# Patient Record
Sex: Male | Born: 1955 | Race: White | Hispanic: No | Marital: Married | Smoking: Never smoker
Health system: Southern US, Community
[De-identification: ages and names within clinical notes are randomized; demographics above are authoritative.]

## PROBLEM LIST (undated history)

## (undated) DIAGNOSIS — R002 Palpitations: Secondary | ICD-10-CM

## (undated) DIAGNOSIS — F329 Major depressive disorder, single episode, unspecified: Secondary | ICD-10-CM

## (undated) DIAGNOSIS — H538 Other visual disturbances: Secondary | ICD-10-CM

## (undated) DIAGNOSIS — Z9889 Other specified postprocedural states: Secondary | ICD-10-CM

## (undated) DIAGNOSIS — F32A Depression, unspecified: Secondary | ICD-10-CM

## (undated) DIAGNOSIS — K219 Gastro-esophageal reflux disease without esophagitis: Secondary | ICD-10-CM

## (undated) DIAGNOSIS — M1990 Unspecified osteoarthritis, unspecified site: Secondary | ICD-10-CM

## (undated) DIAGNOSIS — F419 Anxiety disorder, unspecified: Secondary | ICD-10-CM

## (undated) DIAGNOSIS — E785 Hyperlipidemia, unspecified: Secondary | ICD-10-CM

## (undated) DIAGNOSIS — R112 Nausea with vomiting, unspecified: Secondary | ICD-10-CM

## (undated) HISTORY — DX: Palpitations: R00.2

## (undated) HISTORY — DX: Other visual disturbances: H53.8

## (undated) HISTORY — PX: COLONOSCOPY: SHX174

---
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
## 1996-10-10 HISTORY — PX: SHOULDER ARTHROSCOPY: SHX128

## 2005-01-04 ENCOUNTER — Encounter: Admission: RE | Admit: 2005-01-04 | Discharge: 2005-01-04 | Payer: Self-pay | Admitting: Unknown Physician Specialty

## 2005-02-02 ENCOUNTER — Encounter: Admission: RE | Admit: 2005-02-02 | Discharge: 2005-02-02 | Payer: Self-pay | Admitting: Unknown Physician Specialty

## 2005-02-23 ENCOUNTER — Encounter: Admission: RE | Admit: 2005-02-23 | Discharge: 2005-02-23 | Payer: Self-pay | Admitting: Unknown Physician Specialty

## 2005-05-30 ENCOUNTER — Ambulatory Visit (HOSPITAL_COMMUNITY): Admission: RE | Admit: 2005-05-30 | Discharge: 2005-05-30 | Payer: Self-pay | Admitting: Orthopedic Surgery

## 2005-05-30 ENCOUNTER — Ambulatory Visit (HOSPITAL_BASED_OUTPATIENT_CLINIC_OR_DEPARTMENT_OTHER): Admission: RE | Admit: 2005-05-30 | Discharge: 2005-05-30 | Payer: Self-pay | Admitting: Orthopedic Surgery

## 2005-05-30 HISTORY — PX: SHOULDER ARTHROSCOPY WITH SUBACROMIAL DECOMPRESSION: SHX5684

## 2005-09-12 ENCOUNTER — Encounter: Admission: RE | Admit: 2005-09-12 | Discharge: 2005-09-12 | Payer: Self-pay | Admitting: Unknown Physician Specialty

## 2006-04-13 ENCOUNTER — Encounter: Admission: RE | Admit: 2006-04-13 | Discharge: 2006-04-13 | Payer: Self-pay | Admitting: Unknown Physician Specialty

## 2008-11-18 ENCOUNTER — Encounter (INDEPENDENT_AMBULATORY_CARE_PROVIDER_SITE_OTHER): Payer: Self-pay | Admitting: Surgery

## 2008-11-18 ENCOUNTER — Ambulatory Visit (HOSPITAL_COMMUNITY): Admission: RE | Admit: 2008-11-18 | Discharge: 2008-11-18 | Payer: Self-pay | Admitting: Surgery

## 2008-11-18 HISTORY — PX: CHOLECYSTECTOMY: SHX55

## 2011-01-25 LAB — URINALYSIS, ROUTINE W REFLEX MICROSCOPIC
Glucose, UA: NEGATIVE mg/dL
Ketones, ur: NEGATIVE mg/dL
Nitrite: NEGATIVE
Protein, ur: NEGATIVE mg/dL
pH: 5 (ref 5.0–8.0)

## 2011-01-25 LAB — DIFFERENTIAL
Basophils Relative: 0 % (ref 0–1)
Lymphs Abs: 1.6 10*3/uL (ref 0.7–4.0)
Monocytes Relative: 14 % — ABNORMAL HIGH (ref 3–12)
Neutro Abs: 3.9 10*3/uL (ref 1.7–7.7)
Neutrophils Relative %: 59 % (ref 43–77)

## 2011-01-25 LAB — CBC
HCT: 44.7 % (ref 39.0–52.0)
Hemoglobin: 15.2 g/dL (ref 13.0–17.0)
MCHC: 34 g/dL (ref 30.0–36.0)
MCV: 91.6 fL (ref 78.0–100.0)
RDW: 13.6 % (ref 11.5–15.5)

## 2011-01-25 LAB — COMPREHENSIVE METABOLIC PANEL
BUN: 13 mg/dL (ref 6–23)
CO2: 27 mEq/L (ref 19–32)
Calcium: 9.3 mg/dL (ref 8.4–10.5)
GFR calc non Af Amer: >60 mL/min (ref >60)
Glucose, Bld: 76 mg/dL (ref 70–99)
Total Protein: 6.7 g/dL (ref 6.0–8.3)

## 2011-01-25 LAB — LIPASE, BLOOD: Lipase: 29 U/L (ref 11–59)

## 2011-01-25 LAB — URINE MICROSCOPIC-ADD ON

## 2011-02-22 NOTE — Op Note (Signed)
[name], [name]                 ACCOUNT NO.:  [id number]   MEDICAL RECORD NO.:  [id number]          PATIENT TYPE:  AMB   LOCATION:  SDS                          FACILITY:  [facility]   PHYSICIAN:  Currie Paris, M.D.DATE OF BIRTH:  Jul 27, 1956   DATE OF PROCEDURE:  DATE OF DISCHARGE:  11/18/2008                               OPERATIVE REPORT   PREOPERATIVE DIAGNOSES:  1. Gallstones.  2. Hemangioma of the liver.   POSTOPERATIVE DIAGNOSES:  1. Gallstones.  2. Hemangioma of the liver.   OPERATION:  Laparoscopic cholecystectomy with operative cholangiogram.   SURGEON:  Currie Paris, MD   ASSISTANT:  Ollen Gross. Vernell Morgans, M.D.   ANESTHESIA:  General endotracheal.   CLINICAL HISTORY:  This is a 55 year old gentleman with biliary type  symptoms and gallstones on ultrasound.  He was incidentally found to  have a large perhaps 10-cm hemangioma of the right lobe of the liver  somewhat posteriorly-inferiorly oriented by scan.  We plan to take his  gallbladder out and do nothing with the hemangioma.   DESCRIPTION OF PROCEDURE:  I saw the patient in the holding area and we  confirmed plans for surgery as outlined above.  He had no further  questions.   He was taken to the operating room and after satisfactory general  endotracheal anesthesia have been obtained, the abdomen was prepped was  draped.  The time-out was performed.   I used 0.25% plain Marcaine for each incision.  The umbilicus incision  was made.  The fascia was opened and the peritoneal cavity entered under  direct vision.  A pursestring was placed, the Hasson introduced and the  abdomen insufflated to 15.   I placed the camera, we saw no obvious abnormalities with exception of  few adhesions to the gallbladder.  The hemangioma itself was not  visible.   I put a 10/11 trocar in the epigastrium and two 5's laterally all under  direct vision.  The patient was put in reverse Trendelenburg and tilted  to the  left.   The omental adhesions were taken down, and the neck of the gallbladder  grasped and retracted laterally.  I opened the peritoneum on either side  of the gallbladder and identified a long segment of cystic duct and the  anterior branch of the cystic artery, put 1 clip on each.   A Cook catheter was introduced percutaneously.  The cystic duct was  opened and the catheter placed and operative angiography done.  This  showed good filling of the duodenum.  No filling defects in the  gallbladder and what appeared to me to be normal anatomy with a fairly  long cystic duct remnant.   The cystic duct was then triple clipped on the stay side and divided.  I  put some additional clips on the artery and what appeared to be some  fibrous tissue just adjacent to it and divided that.  A further  dissection showed what I believed was the posterior branch of the  artery, which was then clipped and divided.  The gallbladder was removed  from below to above.  Some vascular structure almost at the top of the  gallbladder was clipped as it was divided and the gallbladder was  removed and placed in the bag, which we irrigated and made sure  everything was dry.  We could not see the hemangioma on the surface of  the liver as it appeared to be covered by thin rim of normal liver  tissue, but there was clearly a bulging in the lower posterior right  lobe of the liver consistent with the mass seen on the CT scan.   The gallbladder was then brought out of the umbilical port.  The abdomen  was re-insufflated and a final irrigation and check for hemostasis was  made.  There was no evidence of bleeding or bile leaks.   The lateral ports removed, and there was no bleeding.  The umbilical  site was closed with the pursestring.  The abdomen was deflated through  the epigastric port.  The skin was closed with 4-0 Monocryl subcuticular  plus Dermabond.   The patient tolerated the procedure well.  There were  no operative  complications.  All counts were correct.      Currie Paris, M.D.  Electronically Signed     CJS/MEDQ  D:  11/18/2008  T:  11/18/2008  Job:  [id number]

## 2011-02-25 NOTE — Op Note (Signed)
NAMESAMIK, BALKCOM                 ACCOUNT NO.:  [id number]   MEDICAL RECORD NO.:  [id number]          PATIENT TYPE:  AMB   LOCATION:  DSC                          FACILITY:  [facility]   PHYSICIAN:  Robert A. Thurston Hole, M.D. DATE OF BIRTH:  [birth year]   DATE OF PROCEDURE:  05/30/2005  DATE OF DISCHARGE:                                 OPERATIVE REPORT   PREOPERATIVE DIAGNOSES:  1.  Right shoulder labrum tear.  2.  Right shoulder loose bodies.  3.  Right shoulder chondromalacia.  4.  Right shoulder impingement.   POSTOPERATIVE DIAGNOSES:  1.  Right shoulder examination under anesthesia followed by arthroscopic      loose body excision, chondroplasty,  partial labrum tear debridement.  2.  Right shoulder subacromial decompression.   SURGEON:  Dr. Salvatore Marvel.   ASSISTANT:  Kirstin Tomasa Rand, P.A.   ANESTHESIA:  General.   OPERATIVE TIME:  40 minutes.   COMPLICATIONS:  None.   INDICATION FOR PROCEDURE:  Mr. Dentinger is a 55 year old gentleman who has had 3-  4 months of increasing right shoulder pain with examined MRI documenting a  labrum tear with chondromalacia and impingement who has failed conservative  care and is now to undergo arthroscopy.   DESCRIPTION:  Mr. Mash was brought to the operating room on May 30, 2005,  after an interscalene block had been placed in the holding room by  anesthesia.  He was placed on the operative table in a supine position.  After being placed under general anesthesia, his right shoulder was  examined.  He had full range of motion and his shoulder was stable to  ligamentous exam.  He was then placed in a beach chair position and his  shoulder and arm was prepped using sterile DuraPrep and draped using sterile  technique.  Originally through an posterior arthroscope portal the  arthroscope with a pump attached was placed into an anterior portal and an  arthroscopic probe was placed.  On initial inspection, the articular  cartilage and the  glenohumeral joint showed significant grade 3  chondromalacia over 50% of the glenoid and the humeral head.  This was  debrided.  There were multiple cartilaginous loose bodies floating in the  joint and these were all removed.  The labrum showed tearing of the superior  anterior labrum, 25-30%, which was debrided.  The biceps tendon anchor and  biceps tendon was intact.  The inferior labrum was frayed, this was  debrided, but the anterior inferior glenohumeral ligament complex was  intact.  The posterior labrum was partially torn and frayed 25-30% and this  was debrided but it was not attached from the glenoid rim.  The rotator cuff  was thoroughly inspected.  There was found to be no evidence of a tear.  The  inferior capsular recess showed multiple small cartilaginous loose bodies  and these were all removed as well as loose bodies from the subscapularis  recess.  After this was done, the subacromial space was entered and a  lateral arthroscopic portal was made.  A large amount of scarred bursitis  was  thoroughly removed and resected.  The rotator cuff underneath this was  intact.  There was some impingement noted and a repeat subacromial  decompression was carried out, removing 4-6 mm of the undersurface of the  anterior, anterolateral and anteromedial acromion and CA ligament scarring  and was further debrided and released.  He had had a previous distal  clavicle excision and no further excision was noted or needed in this area,  but there was scar tissue under the [facility] joint area and this was removed with  cautery.  After this was done, the shoulder could be brought through a full  range of motion with no impingement on the rotator cuff.  At this point, it  was felt that all pathology had been satisfactorily addressed.  Instruments  were removed.  The portal was closed with a 3-0 nylon suture.  Sterile  dressings and a sling applied and the patient awakened and taken to the  recovery  room in stable condition.   FOLLOWUP CARE:  Mr. Almas will be followed as an outpatient on Vicodin and  Naprosyn.  Will see him back in the office in a week for sutures out and  followup.      Robert A. Thurston Hole, M.D.  Electronically Signed     RAW/MEDQ  D:  05/30/2005  T:  05/30/2005  Job:  [id number]

## 2011-06-03 ENCOUNTER — Ambulatory Visit
Admission: RE | Admit: 2011-06-03 | Discharge: 2011-06-03 | Disposition: A | Discharge status: Home / Self Care | Payer: BC Managed Care – PPO | Source: Ambulatory Visit | Attending: Emergency Medicine | Admitting: Emergency Medicine

## 2011-06-03 ENCOUNTER — Other Ambulatory Visit: Payer: Self-pay | Admitting: Emergency Medicine

## 2011-06-03 DIAGNOSIS — R05 Cough: Secondary | ICD-10-CM

## 2012-08-15 ENCOUNTER — Other Ambulatory Visit: Payer: Self-pay | Admitting: Orthopedic Surgery

## 2012-08-16 ENCOUNTER — Encounter (HOSPITAL_BASED_OUTPATIENT_CLINIC_OR_DEPARTMENT_OTHER): Payer: Self-pay | Admitting: *Deleted

## 2012-08-20 ENCOUNTER — Encounter (HOSPITAL_BASED_OUTPATIENT_CLINIC_OR_DEPARTMENT_OTHER): Payer: Self-pay | Admitting: *Deleted

## 2012-08-20 NOTE — Progress Notes (Signed)
No cardiac or resp problems 

## 2012-08-22 ENCOUNTER — Encounter (HOSPITAL_BASED_OUTPATIENT_CLINIC_OR_DEPARTMENT_OTHER): Payer: Self-pay | Admitting: *Deleted

## 2012-08-22 ENCOUNTER — Encounter (HOSPITAL_BASED_OUTPATIENT_CLINIC_OR_DEPARTMENT_OTHER)
Admission: RE | Disposition: A | Discharge status: Home / Self Care | Payer: Self-pay | Source: Ambulatory Visit | Attending: Orthopedic Surgery

## 2012-08-22 ENCOUNTER — Ambulatory Visit (HOSPITAL_BASED_OUTPATIENT_CLINIC_OR_DEPARTMENT_OTHER): Payer: BC Managed Care – PPO | Admitting: Anesthesiology

## 2012-08-22 ENCOUNTER — Ambulatory Visit (HOSPITAL_BASED_OUTPATIENT_CLINIC_OR_DEPARTMENT_OTHER)
Admission: RE | Admit: 2012-08-22 | Discharge: 2012-08-22 | Disposition: A | Discharge status: Home / Self Care | Payer: BC Managed Care – PPO | Source: Ambulatory Visit | Attending: Orthopedic Surgery | Admitting: Orthopedic Surgery

## 2012-08-22 ENCOUNTER — Encounter (HOSPITAL_BASED_OUTPATIENT_CLINIC_OR_DEPARTMENT_OTHER): Payer: Self-pay | Admitting: Anesthesiology

## 2012-08-22 DIAGNOSIS — K219 Gastro-esophageal reflux disease without esophagitis: Secondary | ICD-10-CM | POA: Insufficient documentation

## 2012-08-22 DIAGNOSIS — M65839 Other synovitis and tenosynovitis, unspecified forearm: Secondary | ICD-10-CM | POA: Insufficient documentation

## 2012-08-22 DIAGNOSIS — E785 Hyperlipidemia, unspecified: Secondary | ICD-10-CM | POA: Insufficient documentation

## 2012-08-22 DIAGNOSIS — Z79899 Other long term (current) drug therapy: Secondary | ICD-10-CM | POA: Insufficient documentation

## 2012-08-22 DIAGNOSIS — M65849 Other synovitis and tenosynovitis, unspecified hand: Secondary | ICD-10-CM | POA: Insufficient documentation

## 2012-08-22 HISTORY — DX: Gastro-esophageal reflux disease without esophagitis: K21.9

## 2012-08-22 HISTORY — DX: Depression, unspecified: F32.A

## 2012-08-22 HISTORY — DX: Nausea with vomiting, unspecified: R11.2

## 2012-08-22 HISTORY — DX: Major depressive disorder, single episode, unspecified: F32.9

## 2012-08-22 HISTORY — PX: TRIGGER FINGER RELEASE: SHX641

## 2012-08-22 HISTORY — DX: Anxiety disorder, unspecified: F41.9

## 2012-08-22 HISTORY — DX: Hyperlipidemia, unspecified: E78.5

## 2012-08-22 HISTORY — DX: Other specified postprocedural states: Z98.890

## 2012-08-22 HISTORY — DX: Unspecified osteoarthritis, unspecified site: M19.90

## 2012-08-22 LAB — POCT HEMOGLOBIN-HEMACUE: Hemoglobin: 14.3 g/dL (ref 13.0–17.0)

## 2012-08-22 SURGERY — RELEASE, A1 PULLEY, FOR TRIGGER FINGER
Anesthesia: Regional | Site: Thumb | Laterality: Right | Wound class: Clean

## 2012-08-22 MED ORDER — CHLORHEXIDINE GLUCONATE 4 % EX LIQD: 60.0000 mL | Freq: Once | CUTANEOUS | Status: DC

## 2012-08-22 MED ORDER — HYDROMORPHONE HCL PF 1 MG/ML IJ SOLN: 0.2500 mg | INTRAMUSCULAR | Status: DC | PRN

## 2012-08-22 MED ORDER — LIDOCAINE HCL (CARDIAC) 20 MG/ML IV SOLN
INTRAVENOUS | Status: DC | PRN
  Administered 2012-08-22: 50 mg via INTRAVENOUS

## 2012-08-22 MED ORDER — CEFAZOLIN SODIUM-DEXTROSE 2-3 GM-% IV SOLR
2.0000 g | INTRAVENOUS | Status: AC
  Administered 2012-08-22: 2 g via INTRAVENOUS

## 2012-08-22 MED ORDER — PROPOFOL 10 MG/ML IV EMUL
INTRAVENOUS | Status: DC | PRN
  Administered 2012-08-22: 80 ug/kg/min via INTRAVENOUS

## 2012-08-22 MED ORDER — LACTATED RINGERS IV SOLN
INTRAVENOUS | Status: DC
  Administered 2012-08-22: 09:00:00 via INTRAVENOUS

## 2012-08-22 MED ORDER — HYDROCODONE-ACETAMINOPHEN 5-325 MG PO TABS: 1.0000 | ORAL_TABLET | Freq: Four times a day (QID) | ORAL | Status: DC | PRN

## 2012-08-22 MED ORDER — ONDANSETRON HCL 4 MG/2ML IJ SOLN
INTRAMUSCULAR | Status: DC | PRN
  Administered 2012-08-22: 4 mg via INTRAVENOUS

## 2012-08-22 MED ORDER — BUPIVACAINE HCL (PF) 0.25 % IJ SOLN
INTRAMUSCULAR | Status: DC | PRN
  Administered 2012-08-22: 2.5 mL

## 2012-08-22 MED ORDER — MIDAZOLAM HCL 5 MG/5ML IJ SOLN
INTRAMUSCULAR | Status: DC | PRN
  Administered 2012-08-22: 1 mg via INTRAVENOUS

## 2012-08-22 MED ORDER — FENTANYL CITRATE 0.05 MG/ML IJ SOLN
INTRAMUSCULAR | Status: DC | PRN
  Administered 2012-08-22 (×2): 50 ug via INTRAVENOUS

## 2012-08-22 MED ORDER — OXYCODONE HCL 5 MG/5ML PO SOLN: 5.0000 mg | Freq: Once | ORAL | Status: DC | PRN

## 2012-08-22 MED ORDER — OXYCODONE HCL 5 MG PO TABS: 5.0000 mg | ORAL_TABLET | Freq: Once | ORAL | Status: DC | PRN

## 2012-08-22 SURGICAL SUPPLY — 36 items
BANDAGE COBAN STERILE 2 (GAUZE/BANDAGES/DRESSINGS) ×2 IMPLANT
BLADE SURG 15 STRL LF DISP TIS (BLADE) ×1 IMPLANT
BLADE SURG 15 STRL SS (BLADE) ×2
BNDG CMPR 9X4 STRL LF SNTH (GAUZE/BANDAGES/DRESSINGS)
BNDG ESMARK 4X9 LF (GAUZE/BANDAGES/DRESSINGS) IMPLANT
CHLORAPREP W/TINT 26ML (MISCELLANEOUS) ×2 IMPLANT
CLOTH BEACON ORANGE TIMEOUT ST (SAFETY) ×2 IMPLANT
CORDS BIPOLAR (ELECTRODE) IMPLANT
COVER MAYO STAND STRL (DRAPES) ×2 IMPLANT
COVER TABLE BACK 60X90 (DRAPES) ×2 IMPLANT
CUFF TOURNIQUET SINGLE 18IN (TOURNIQUET CUFF) IMPLANT
DECANTER SPIKE VIAL GLASS SM (MISCELLANEOUS) IMPLANT
DRAPE EXTREMITY T 121X128X90 (DRAPE) ×2 IMPLANT
DRAPE SURG 17X23 STRL (DRAPES) ×2 IMPLANT
GAUZE XEROFORM 1X8 LF (GAUZE/BANDAGES/DRESSINGS) ×2 IMPLANT
GLOVE BIO SURGEON STRL SZ 6.5 (GLOVE) IMPLANT
GLOVE BIOGEL PI IND STRL 8.5 (GLOVE) ×1 IMPLANT
GLOVE BIOGEL PI INDICATOR 8.5 (GLOVE) ×1
GLOVE SKINSENSE NS SZ7.0 (GLOVE) ×1
GLOVE SKINSENSE STRL SZ7.0 (GLOVE) IMPLANT
GLOVE SURG ORTHO 8.0 STRL STRW (GLOVE) ×2 IMPLANT
GOWN BRE IMP PREV XXLGXLNG (GOWN DISPOSABLE) ×2 IMPLANT
GOWN PREVENTION PLUS XLARGE (GOWN DISPOSABLE) ×2 IMPLANT
NEEDLE 27GAX1X1/2 (NEEDLE) ×2 IMPLANT
NS IRRIG 1000ML POUR BTL (IV SOLUTION) ×2 IMPLANT
PACK BASIN DAY SURGERY FS (CUSTOM PROCEDURE TRAY) ×2 IMPLANT
PADDING CAST ABS 4INX4YD NS (CAST SUPPLIES) ×1
PADDING CAST ABS COTTON 4X4 ST (CAST SUPPLIES) ×1 IMPLANT
SPONGE GAUZE 4X4 12PLY (GAUZE/BANDAGES/DRESSINGS) ×2 IMPLANT
STOCKINETTE 4X48 STRL (DRAPES) ×2 IMPLANT
SUT VICRYL RAPIDE 4/0 PS 2 (SUTURE) ×2 IMPLANT
SYR BULB 3OZ (MISCELLANEOUS) ×2 IMPLANT
SYR CONTROL 10ML LL (SYRINGE) IMPLANT
TOWEL OR 17X24 6PK STRL BLUE (TOWEL DISPOSABLE) ×4 IMPLANT
UNDERPAD 30X30 INCONTINENT (UNDERPADS AND DIAPERS) ×2 IMPLANT
WATER STERILE IRR 1000ML POUR (IV SOLUTION) ×2 IMPLANT

## 2012-08-22 NOTE — Anesthesia Postprocedure Evaluation (Signed)
  Anesthesia Post-op Note  Patient: [name]  Procedure(s) Performed: Procedure(s) (LRB) with comments: RELEASE TRIGGER FINGER/A-1 PULLEY (Right)  Patient Location: PACU  Anesthesia Type:MAC and Bier block  Level of Consciousness: awake, alert  and oriented  Airway and Oxygen Therapy: Patient Spontanous Breathing  Post-op Pain: none  Post-op Assessment: Post-op Vital signs reviewed, Patient's Cardiovascular Status Stable, Respiratory Function Stable, Patent Airway and No signs of Nausea or vomiting  Post-op Vital Signs: Reviewed and stable  Complications: No apparent anesthesia complications

## 2012-08-22 NOTE — Anesthesia Procedure Notes (Signed)
Procedure Name: MAC Date/Time: 08/22/2012 10:19 AM Performed by: Gar Gibbon Pre-anesthesia Checklist: Patient identified, Timeout performed, Emergency Drugs available, Suction available and Patient being monitored Patient Re-evaluated:Patient Re-evaluated prior to inductionOxygen Delivery Method: Simple face mask Placement Confirmation: positive ETCO2

## 2012-08-22 NOTE — H&P (Signed)
[name] is a 56 year-old right-hand dominant male referred by Dr. Cleda Clarks for consultation with respect to catching of his right thumb.  This has been going on for approximately three months.  He has no history of injury. He has been taking Arthrotek, he has no history of diabetes or thyroid problems.  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  He complains of constant, moderate dull aching type pain with a feeling of swelling.  It has not significantly changed.  Activity makes it worse.  The Arthrotek has not significantly helped. There is no family history of diabetes, there is a family history of arthritis.   ALLERGIES:    None.  MEDICATIONS:     Arthrotek, sertraline, simvastatin.  SURGICAL HISTORY:    Right shoulder surgery x 2, cholecystectomy.  FAMILY MEDICAL HISTORY:   Positive for heart disease and arthritis.  SOCIAL HISTORY:    He does not smoke, drinks socially, married.   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    REVIEW OF SYSTEMS:    Positive for glasses, depression, nervousness, otherwise negative 14 points.  [name] is an 56 y.o. male.   Chief Complaint: STS rt thumb HPI: see above  Past Medical History  Diagnosis Date  . Hyperlipemia   . Arthritis   . GERD (gastroesophageal reflux disease)   . Depression   . Anxiety   . PONV (postoperative nausea and vomiting)     Past Surgical History  Procedure Date  . Shoulder arthroscopy with subacromial decompression 05/30/2005    right - with loose body exc., chondroplasty, partial labrum tear debridement  . Shoulder arthroscopy 1998    rt  . Colonoscopy   . Cholecystectomy 11/18/2008    lap. chole.  . Vein ligation 1978    left    History reviewed. No pertinent family history. Social History:  reports that he has never smoked. He does not have any smokeless tobacco history on file. He reports that he drinks alcohol. He reports that he does not use illicit drugs.  Allergies: No Known  Allergies  Medications Prior to Admission  Medication Sig Dispense Refill  . aspirin 81 MG tablet Take 81 mg by mouth every evening.      . fish oil-omega-3 fatty acids 1000 MG capsule Take 2 g by mouth daily.      . Multiple Vitamins-Minerals (MULTIVITAMIN WITH MINERALS) tablet Take 1 tablet by mouth every evening.      Marland Kitchen omeprazole (PRILOSEC) 20 MG capsule Take 20 mg by mouth every evening.      . sertraline (ZOLOFT) 50 MG tablet Take 50 mg by mouth every evening.      . simvastatin (ZOCOR) 20 MG tablet Take 20 mg by mouth at bedtime.      Marland Kitchen zolpidem (AMBIEN) 5 MG tablet Take 5 mg by mouth at bedtime as needed.        No results found for this or any previous visit (from the past 48 hour(s)).  No results found.   Pertinent items are noted in HPI.  Blood pressure 158/102, pulse 65, temperature 97.4 F (36.3 C), temperature source Oral, resp. rate 20, height 6\' 5"  (1.956 m), weight 99.338 kg (219 lb), SpO2 96.00%.  General appearance: alert, cooperative and appears stated age Head: Normocephalic, without obvious abnormality Neck: no adenopathy Resp: clear to auscultation bilaterally Cardio: regular rate and rhythm, S1, S2 normal, no murmur, click, rub or gallop GI: soft, non-tender; bowel sounds  normal; no masses,  no organomegaly Extremities: extremities normal, atraumatic, no cyanosis or edema Pulses: 2+ and symmetric Skin: Skin color, texture, turgor normal. No rashes or lesions Neurologic: Grossly normal Incision/Wound: na  Assessment/Plan DIAGNOSIS:    Stenosing tenosynovitis, failure of injections, conservative treatment right thumb.    He would like to have this surgically released.  The pre, peri and postoperative course were discussed along with the risks and complications.  The patient is aware there is no guarantee with the surgery, possibility of infection, recurrence, injury to arteries, nerves, tendons, incomplete relief of symptoms and dystrophy.  He is  scheduled for release A-1 pulley right thumb as an outpatient.    Chaney Ingram R 08/22/2012, 9:23 AM

## 2012-08-22 NOTE — Transfer of Care (Signed)
Immediate Anesthesia Transfer of Care Note  Patient: [name]  Procedure(s) Performed: Procedure(s) (LRB) with comments: RELEASE TRIGGER FINGER/A-1 PULLEY (Right)  Patient Location: PACU  Anesthesia Type:Bier block  Level of Consciousness: awake, alert  and oriented  Airway & Oxygen Therapy: Patient Spontanous Breathing  Post-op Assessment: Report given to PACU RN and Post -op Vital signs reviewed and stable  Post vital signs: Reviewed and stable  Complications: No apparent anesthesia complications

## 2012-08-22 NOTE — Anesthesia Preprocedure Evaluation (Signed)
Anesthesia Evaluation  Patient identified by MRN, date of birth, ID band Patient awake    Reviewed: Allergy & Precautions, H&P , NPO status , Patient's Chart, lab work & pertinent test results  History of Anesthesia Complications (+) PONV  Airway Mallampati: I TM Distance: >3 FB Neck ROM: Full    Dental No notable dental hx. (+) Teeth Intact and Dental Advisory Given   Pulmonary neg pulmonary ROS,  breath sounds clear to auscultation  Pulmonary exam normal       Cardiovascular negative cardio ROS  Rhythm:Regular Rate:Normal     Neuro/Psych PSYCHIATRIC DISORDERS negative neurological ROS     GI/Hepatic Neg liver ROS, GERD-  Medicated and Controlled,  Endo/Other  negative endocrine ROS  Renal/GU negative Renal ROS  negative genitourinary   Musculoskeletal   Abdominal   Peds  Hematology negative hematology ROS (+)   Anesthesia Other Findings   Reproductive/Obstetrics negative OB ROS                           Anesthesia Physical Anesthesia Plan  ASA: II  Anesthesia Plan: MAC and Bier Block   Post-op Pain Management:    Induction: Intravenous  Airway Management Planned: Simple Face Mask  Additional Equipment:   Intra-op Plan:   Post-operative Plan:   Informed Consent: I have reviewed the patients History and Physical, chart, labs and discussed the procedure including the risks, benefits and alternatives for the proposed anesthesia with the patient or authorized representative who has indicated his/her understanding and acceptance.   Dental advisory given  Plan Discussed with: CRNA  Anesthesia Plan Comments:         Anesthesia Quick Evaluation

## 2012-08-22 NOTE — Brief Op Note (Signed)
08/22/2012  10:46 AM  PATIENT:  [name]  56 y.o. male  PRE-OPERATIVE DIAGNOSIS:  stenosing tenosynovitis RIGHT THUMB  POST-OPERATIVE DIAGNOSIS:  stenosing tenosynovitis RIGHT THUMB  PROCEDURE:  Procedure(s) (LRB) with comments: RELEASE TRIGGER FINGER/A-1 PULLEY (Right)  SURGEON:  Surgeon(s) and Role:    * Nicki Reaper, MD - Primary  PHYSICIAN ASSISTANT:   ASSISTANTS: none   ANESTHESIA:   local and regional  EBL:  Total I/O In: 200 [I.V.:200] Out: -   BLOOD ADMINISTERED:none  DRAINS: none   LOCAL MEDICATIONS USED:  MARCAINE     SPECIMEN:  No Specimen  DISPOSITION OF SPECIMEN:  N/A  COUNTS:  YES  TOURNIQUET:  * Missing tourniquet times found for documented tourniquets in log:  [id number] *  DICTATION: .Other Dictation: Dictation Number [id number]  PLAN OF CARE: Discharge to home after PACU  PATIENT DISPOSITION:  PACU - hemodynamically stable.

## 2012-08-22 NOTE — Op Note (Signed)
Dictation Number [id number]

## 2012-08-23 ENCOUNTER — Encounter (HOSPITAL_BASED_OUTPATIENT_CLINIC_OR_DEPARTMENT_OTHER): Payer: Self-pay | Admitting: Orthopedic Surgery

## 2012-08-23 NOTE — Op Note (Signed)
[name], [name]                 ACCOUNT NO.:  [id number]  MEDICAL RECORD NO.:  [id number]  LOCATION:                                 FACILITY:  PHYSICIAN:  Cindee Salt, M.D.            DATE OF BIRTH:  DATE OF PROCEDURE:  08/22/2012 DATE OF DISCHARGE:                              OPERATIVE REPORT   PREOPERATIVE DIAGNOSIS:  Stenosing tenosynovitis, right thumb.  POSTOPERATIVE DIAGNOSIS:  Stenosing tenosynovitis, right thumb.  OPERATION:  Release A1 pulley, right thumb.  SURGEON:  Cindee Salt, MD  ANESTHESIA:  Forearm-based IV regional with local infiltration.  ANESTHESIOLOGIST:  Zenon Mayo, MD  HISTORY:  The patient is a 56 year old male with a history of triggering of his right thumb.  This has not responded to conservative treatment. He has elected to undergo surgical release.  Pre, peri, and postoperative course are discussed along with risks and complications. He is aware that there is no guarantee with the surgery, possibility of infection; recurrence of injury to arteries, nerves, tendons, incomplete relief of symptoms, dystrophy.  In the preoperative area, the patient is seen, the extremity marked by both the patient and surgeon.  Antibiotic given.  PROCEDURE:  The patient was brought to the operating room where forearm- based IV regional anesthetic was carried out without difficulty.  He was prepped using ChloraPrep, supine position, right arm free.  A 3-minute dry time was allowed.  Time-out taken, confirming the patient and procedure.  A transverse incision was made over the A1 pulley of the right thumb, carried down through subcutaneous tissue.  A very thickened A1 pulley was immediately identified.  This was isolated, retractors placed protecting the digital nerves.  An incision was then made on the radial aspect of the A1 pulley.  Oblique pulley was left intact.  The synovium proximally was separated with blunt dissection.  Finger placed through full  range motion, no further triggering was noted.  The wound was irrigated and closed with interrupted 4-0 Vicryl Rapide sutures. Local infiltration with 3 mL of 0.25% Marcaine without epinephrine was given.  A sterile compressive dressing was applied.  On deflation of the tourniquet, all fingers immediately pinked.  He was taken to the recovery room for observation in satisfactory condition.  He will be discharged home to return to the [facility] in 1 week on Vicodin.         ______________________________ Cindee Salt, M.D.    GK/MEDQ  D:  08/22/2012  T:  08/23/2012  Job:  [id number]

## 2017-05-05 ENCOUNTER — Emergency Department (HOSPITAL_COMMUNITY)
Admission: EM | Admit: 2017-05-05 | Discharge: 2017-05-06 | Disposition: A | Discharge status: Home / Self Care | Payer: BLUE CROSS/BLUE SHIELD | Attending: Emergency Medicine | Admitting: Emergency Medicine

## 2017-05-05 ENCOUNTER — Encounter (HOSPITAL_COMMUNITY): Payer: Self-pay

## 2017-05-05 DIAGNOSIS — Y929 Unspecified place or not applicable: Secondary | ICD-10-CM | POA: Diagnosis not present

## 2017-05-05 DIAGNOSIS — Y939 Activity, unspecified: Secondary | ICD-10-CM | POA: Diagnosis not present

## 2017-05-05 DIAGNOSIS — W010XXA Fall on same level from slipping, tripping and stumbling without subsequent striking against object, initial encounter: Secondary | ICD-10-CM | POA: Insufficient documentation

## 2017-05-05 DIAGNOSIS — Z23 Encounter for immunization: Secondary | ICD-10-CM | POA: Diagnosis not present

## 2017-05-05 DIAGNOSIS — Y999 Unspecified external cause status: Secondary | ICD-10-CM | POA: Diagnosis not present

## 2017-05-05 DIAGNOSIS — S51011A Laceration without foreign body of right elbow, initial encounter: Secondary | ICD-10-CM | POA: Insufficient documentation

## 2017-05-05 DIAGNOSIS — S025XXA Fracture of tooth (traumatic), initial encounter for closed fracture: Secondary | ICD-10-CM | POA: Diagnosis not present

## 2017-05-05 DIAGNOSIS — Z79899 Other long term (current) drug therapy: Secondary | ICD-10-CM | POA: Insufficient documentation

## 2017-05-05 NOTE — ED Triage Notes (Signed)
Pt tripped and fell tonight. He presents with a laceration on his R elbow and an abrasion to his R knee. He states that he also chipped a tooth. He denies LOC, blood thinners, or head pain. A&Ox4. Ambulatory.

## 2017-05-06 ENCOUNTER — Emergency Department (HOSPITAL_COMMUNITY): Payer: BLUE CROSS/BLUE SHIELD

## 2017-05-06 MED ORDER — LIDOCAINE HCL 2 % IJ SOLN: 20.0000 mL | Freq: Once | INTRAMUSCULAR | Status: DC

## 2017-05-06 MED ORDER — AMOXICILLIN-POT CLAVULANATE 875-125 MG PO TABS: 1.0000 | ORAL_TABLET | Freq: Two times a day (BID) | ORAL | 0 refills | Status: DC

## 2017-05-06 MED ORDER — LIDOCAINE HCL (PF) 1 % IJ SOLN
30.0000 mL | Freq: Once | INTRAMUSCULAR | Status: AC
  Administered 2017-05-06: 30 mL
  Filled 2017-05-06: qty 30

## 2017-05-06 MED ORDER — TETANUS-DIPHTH-ACELL PERTUSSIS 5-2.5-18.5 LF-MCG/0.5 IM SUSP
0.5000 mL | Freq: Once | INTRAMUSCULAR | Status: AC
  Administered 2017-05-06: 0.5 mL via INTRAMUSCULAR
  Filled 2017-05-06: qty 0.5

## 2017-05-06 NOTE — ED Provider Notes (Signed)
[facility]-EMERGENCY DEPT Provider Note   CSN: [id number] Arrival date & time: 05/05/17  2311     History   Chief Complaint Chief Complaint  Patient presents with  . Extremity Laceration    R elbow  . Fall    HPI COPE MARTE is a 61 y.o. male.  HPI Patient presents to the emergency department with injuries from a fall.  The patient states he tripped and fell, causing a laceration to his right elbow abrasion to his right knee.  He also states that he broke one of his teeth and has a abrasion to the right earlobe states that he did not hit his head significantly.  He did not lose consciousness.  He is having no neck pain and back pain.  Patient not take any medications prior to route.  We applied pressure to the wound to stop the bleeding.  Patient denies headache, blurred vision, weakness, numbness, dizziness, back pain, neck pain, chest pain, shortness breath, abdominal pain or syncope Past Medical History:  Diagnosis Date  . Anxiety   . Arthritis   . Depression   . GERD (gastroesophageal reflux disease)   . Hyperlipemia   . PONV (postoperative nausea and vomiting)     There are no active problems to display for this patient.   Past Surgical History:  Procedure Laterality Date  . CHOLECYSTECTOMY  11/18/2008   lap. chole.  Marland Kitchen COLONOSCOPY    . SHOULDER ARTHROSCOPY  1998   rt  . SHOULDER ARTHROSCOPY WITH SUBACROMIAL DECOMPRESSION  05/30/2005   right - with loose body exc., chondroplasty, partial labrum tear debridement  . TRIGGER FINGER RELEASE  08/22/2012   Procedure: RELEASE TRIGGER FINGER/A-1 PULLEY;  Surgeon: Nicki Reaper, MD;  Location: [facility];  Service: Orthopedics;  Laterality: Right;  . VEIN LIGATION  1978   left       Home Medications    Prior to Admission medications   Medication Sig Start Date End Date Taking? Authorizing Provider  amoxicillin-clavulanate (AUGMENTIN) 875-125 MG tablet Take 1 tablet by mouth every 12 (twelve) hours. 05/06/17    Emmitt Matthews, Cristal Deer, PA-C  aspirin 81 MG tablet Take 81 mg by mouth every evening.    [provider]  fish oil-omega-3 fatty acids 1000 MG capsule Take 2 g by mouth daily.    [provider]  HYDROcodone-acetaminophen (NORCO) 5-325 MG per tablet Take 1 tablet by mouth every 6 (six) hours as needed for pain. 08/22/12   Cindee Salt, MD  Multiple Vitamins-Minerals (MULTIVITAMIN WITH MINERALS) tablet Take 1 tablet by mouth every evening.    [provider]  omeprazole (PRILOSEC) 20 MG capsule Take 20 mg by mouth every evening.    [provider]  sertraline (ZOLOFT) 50 MG tablet Take 50 mg by mouth every evening.    [provider]  simvastatin (ZOCOR) 20 MG tablet Take 20 mg by mouth at bedtime.    [provider]  zolpidem (AMBIEN) 5 MG tablet Take 5 mg by mouth at bedtime as needed.    [provider]    Family History History reviewed. No pertinent family history.  Social History Social History  Substance Use Topics  . Smoking status: Never Smoker  . Smokeless tobacco: Not on file  . Alcohol use Yes     Comment: daily wine     Allergies   Patient has no known allergies.   Review of Systems Review of Systems  All other systems negative except as documented  in the HPI. All pertinent positives and negatives as reviewed in the HPI. Physical Exam Updated Vital Signs BP 138/89 (BP Location: Left Arm)   Pulse 80   Temp 98.7 F (37.1 C) (Oral)   Resp 14   Ht 6\' 5"  (1.956 m)   Wt 93 kg (205 lb)   SpO2 100%   BMI 24.31 kg/m   Physical Exam  Constitutional: He is oriented to person, place, and time. He appears well-developed and well-nourished. No distress.  HENT:  Head: Normocephalic and atraumatic.  Mouth/Throat:    Eyes: Pupils are equal, round, and reactive to light.  Pulmonary/Chest: Effort normal.  Musculoskeletal:       Cervical back: Normal.       Lumbar back: Normal.       Right forearm: He exhibits  swelling and laceration. He exhibits no tenderness, no bony tenderness, no edema and no deformity.  Neurological: He is alert and oriented to person, place, and time.  Skin: Skin is warm and dry.  Psychiatric: He has a normal mood and affect.  Nursing note and vitals reviewed.    ED Treatments / Results  Labs (all labs ordered are listed, but only abnormal results are displayed) Labs Reviewed - No data to display  EKG  EKG Interpretation None       Radiology Dg Elbow Complete Right  Result Date: 05/06/2017 CLINICAL DATA:  Trip and fall injury at home tonight. EXAM: RIGHT ELBOW - COMPLETE 3+ VIEW COMPARISON:  None. FINDINGS: There is no evidence of fracture, dislocation, or joint effusion. There is no evidence of arthropathy or other focal bone abnormality. Soft tissues irregularity posteriorly. No intra-articular air. IMPRESSION: Negative for fracture or dislocation. No intra-articular air. No radiopaque foreign body. Electronically Signed   By: Ellery Plunkaniel R Mitchell M.D.   On: 05/06/2017 00:50    Procedures Procedures (including critical care time)  Medications Ordered in ED Medications  Tdap (BOOSTRIX) injection 0.5 mL (0.5 mLs Intramuscular Given 05/06/17 0029)  lidocaine (PF) (XYLOCAINE) 1 % injection 30 mL (30 mLs Infiltration Given 05/06/17 0142)     Initial Impression / Assessment and Plan / ED Course  I have reviewed the triage vital signs and the nursing notes.  Pertinent labs & imaging results that were available during my care of the patient were reviewed by me and considered in my medical decision making (see chart for details).   patient most likely has a bursa injury.  I did loosely approximate the last suture to allow for any possible drainage.  I did advise him that this could get infected and would need follow-up and to give him orthopedic follow-up.  I also did place him on antibiotics.  Told to ice and elevate the elbow.  Patient agrees the plan and all questions  were answered.  I did do dental cement on the broken tooth.  The tooth was still intact but very loose in damage with a midline fracture.   LACERATION REPAIR Performed by: Carlyle DollyLAWYER,Beacher Every W Authorized by: Carlyle DollyLAWYER,Wilmar Prabhakar W Consent: Verbal consent obtained. Risks and benefits: risks, benefits and alternatives were discussed Consent given by: patient Patient identity confirmed: provided demographic data Prepped and Draped in normal sterile fashion Wound explored  Laceration Location: Right elbow  Laceration Length: 1.5 cm  No Foreign Bodies seen or palpated  Anesthesia: local infiltration  Local anesthetic: lidocaine 2 % without epinephrine  Anesthetic total: 6 ml  Irrigation method: syringe Amount of cleaning: standard  Skin closure: 4-0 Prolene   Number of  sutures: 3   Technique: Simple interrupted   Patient tolerance: Patient tolerated the procedure well with no immediate complications.   Final Clinical Impressions(s) / ED Diagnoses   Final diagnoses:  Laceration of right elbow, initial encounter  Closed fracture of tooth, initial encounter    New Prescriptions Discharge Medication List as of 05/06/2017  1:42 AM    START taking these medications   Details  amoxicillin-clavulanate (AUGMENTIN) 875-125 MG tablet Take 1 tablet by mouth every 12 (twelve) hours., Starting Sat 05/06/2017, Print         Grae Leathers, Marshalltonhristopher, PA-C 05/06/17 0205    Tilden Fossaees, Elizabeth, MD 05/07/17 [phone]

## 2017-05-06 NOTE — Discharge Instructions (Signed)
Contact your dentist for an appointment as soon as possible.  I did give you the name of the on-call orthopedist as needed for any follow-up.  I would advise ice and elevation over the elbow, as there may be a bursa injury.  Return for any signs of infection

## 2017-05-06 NOTE — ED Notes (Signed)
Lidocaine at bedside.

## 2020-02-14 ENCOUNTER — Other Ambulatory Visit: Payer: Self-pay | Admitting: Family Medicine

## 2020-02-14 DIAGNOSIS — R29818 Other symptoms and signs involving the nervous system: Secondary | ICD-10-CM

## 2020-02-14 DIAGNOSIS — R519 Headache, unspecified: Secondary | ICD-10-CM

## 2020-02-14 DIAGNOSIS — H81399 Other peripheral vertigo, unspecified ear: Secondary | ICD-10-CM

## 2020-02-14 NOTE — Progress Notes (Signed)
64yo male w/ new onset episodic vertigo and ataxia and vision change. Ongoing for the last couple of months. Intermittent HAs - chronic.   Shelly Flatten, MD Family Medicine 02/14/2020, 2:50 PM

## 2020-03-23 ENCOUNTER — Ambulatory Visit
Admission: RE | Admit: 2020-03-23 | Discharge: 2020-03-23 | Disposition: A | Discharge status: Home / Self Care | Payer: BC Managed Care – PPO | Source: Ambulatory Visit | Attending: Family Medicine | Admitting: Family Medicine

## 2020-03-23 ENCOUNTER — Other Ambulatory Visit: Payer: Self-pay

## 2020-03-23 ENCOUNTER — Ambulatory Visit
Admission: RE | Admit: 2020-03-23 | Discharge: 2020-03-23 | Disposition: A | Discharge status: Home / Self Care | Payer: BLUE CROSS/BLUE SHIELD | Source: Ambulatory Visit | Attending: Family Medicine | Admitting: Family Medicine

## 2020-03-23 DIAGNOSIS — R519 Headache, unspecified: Secondary | ICD-10-CM

## 2020-03-23 DIAGNOSIS — R29818 Other symptoms and signs involving the nervous system: Secondary | ICD-10-CM

## 2020-03-23 DIAGNOSIS — H81399 Other peripheral vertigo, unspecified ear: Secondary | ICD-10-CM

## 2020-03-23 MED ORDER — GADOBENATE DIMEGLUMINE 529 MG/ML IV SOLN
20.0000 mL | Freq: Once | INTRAVENOUS | Status: AC | PRN
  Administered 2020-03-23: 20 mL via INTRAVENOUS

## 2020-03-25 ENCOUNTER — Other Ambulatory Visit: Payer: Self-pay | Admitting: Family Medicine

## 2020-03-25 DIAGNOSIS — R0609 Other forms of dyspnea: Secondary | ICD-10-CM

## 2020-03-25 NOTE — Progress Notes (Signed)
Pt exercises regularly w/o dyspnea. However he has had 2 events now several months apart w/ blurry vision and dizziness after exertion. Most recent event was also associated w/ BP of 180/120 and tachycardia. ECG in office w/ non-specific Twave changes in inferior leads but is otherwise nml. Pt on Simvastatin for HLD w/o other controllable ASCVD risk factors.

## 2020-04-29 ENCOUNTER — Telehealth (HOSPITAL_COMMUNITY): Payer: Self-pay

## 2020-04-29 NOTE — Telephone Encounter (Signed)
Encounter complete. 

## 2020-05-01 ENCOUNTER — Other Ambulatory Visit: Payer: Self-pay

## 2020-05-01 ENCOUNTER — Ambulatory Visit (HOSPITAL_COMMUNITY)
Admission: RE | Admit: 2020-05-01 | Discharge: 2020-05-01 | Disposition: A | Discharge status: Home / Self Care | Payer: BC Managed Care – PPO | Source: Ambulatory Visit | Attending: Cardiovascular Disease | Admitting: Cardiovascular Disease

## 2020-05-01 DIAGNOSIS — R0600 Dyspnea, unspecified: Secondary | ICD-10-CM | POA: Diagnosis not present

## 2020-05-01 DIAGNOSIS — R0609 Other forms of dyspnea: Secondary | ICD-10-CM

## 2020-05-01 LAB — EXERCISE TOLERANCE TEST
Estimated workload: 8.6 METS
Exercise duration (min): 7 min
Exercise duration (sec): 6 s
MPHR: 158 {beats}/min
Peak HR: 162 {beats}/min
Percent HR: 102 %
Rest HR: 74 {beats}/min

## 2021-02-26 ENCOUNTER — Other Ambulatory Visit: Payer: Self-pay | Admitting: Family Medicine

## 2021-02-26 DIAGNOSIS — M25562 Pain in left knee: Secondary | ICD-10-CM

## 2021-02-26 DIAGNOSIS — M25559 Pain in unspecified hip: Secondary | ICD-10-CM

## 2021-02-26 DIAGNOSIS — G8929 Other chronic pain: Secondary | ICD-10-CM

## 2021-03-09 ENCOUNTER — Other Ambulatory Visit: Payer: Self-pay

## 2021-03-09 ENCOUNTER — Ambulatory Visit (HOSPITAL_BASED_OUTPATIENT_CLINIC_OR_DEPARTMENT_OTHER)
Admission: RE | Admit: 2021-03-09 | Discharge: 2021-03-09 | Disposition: A | Discharge status: Home / Self Care | Payer: 59 | Source: Ambulatory Visit | Attending: Family Medicine | Admitting: Family Medicine

## 2021-03-09 DIAGNOSIS — G8929 Other chronic pain: Secondary | ICD-10-CM | POA: Insufficient documentation

## 2021-03-09 DIAGNOSIS — M25562 Pain in left knee: Secondary | ICD-10-CM | POA: Diagnosis present

## 2021-03-09 DIAGNOSIS — M25561 Pain in right knee: Secondary | ICD-10-CM | POA: Insufficient documentation

## 2021-03-09 DIAGNOSIS — M25559 Pain in unspecified hip: Secondary | ICD-10-CM | POA: Insufficient documentation

## 2021-03-29 ENCOUNTER — Ambulatory Visit (INDEPENDENT_AMBULATORY_CARE_PROVIDER_SITE_OTHER): Payer: 59 | Admitting: Neurology

## 2021-03-29 ENCOUNTER — Other Ambulatory Visit: Payer: Self-pay | Admitting: Neurology

## 2021-03-29 ENCOUNTER — Encounter: Payer: Self-pay | Admitting: Neurology

## 2021-03-29 ENCOUNTER — Other Ambulatory Visit: Payer: Self-pay

## 2021-03-29 VITALS — BP 149/92 | HR 79 | Ht 76.0 in | Wt 219.2 lb

## 2021-03-29 DIAGNOSIS — G8929 Other chronic pain: Secondary | ICD-10-CM

## 2021-03-29 DIAGNOSIS — R269 Unspecified abnormalities of gait and mobility: Secondary | ICD-10-CM | POA: Diagnosis not present

## 2021-03-29 DIAGNOSIS — M545 Low back pain, unspecified: Secondary | ICD-10-CM

## 2021-03-29 NOTE — Progress Notes (Signed)
Reason for visit: Gait disturbance, hand numbness  Referring physician: Dr. Ilda Basset is a 65 y.o. male  History of present illness:  [name] is a 65 year old right-handed white male with a 3 to 4-year history of some difficulty with gait instability.  The patient may notice this oftentimes when doing yard work, he may feel a sensation of dizziness.  The patient has fallen on occasion.  At times, the gait instability is much worse than others.  In May 2021, he had laser surgery on the eyes, several weeks later at the beach he was walking in the direction of the sun on the beach and felt wobbly and had significant gait instability.  He has developed some numbness in both hands that is worse in the morning.  He oftentimes notes that his balance is also worse in the morning.  He denies any problems controlling the bowels or the bladder but he does have some chronic low back pain without radiation into the legs.  He denies any significant neck discomfort.  He denies any shocking sensations into the arms or legs with head movement.  He notes that if he stands for too long, he may have a stiffness in the legs.  Due to the ongoing gait problems and numbness, he is sent to this office for further evaluation.  He denies any numbness of the feet, he does feel slightly weak in the legs.  Past Medical History:  Diagnosis Date   Anxiety    Arthritis    Depression    GERD (gastroesophageal reflux disease)    Hyperlipemia    PONV (postoperative nausea and vomiting)     Past Surgical History:  Procedure Laterality Date   CHOLECYSTECTOMY  11/18/2008   lap. chole.   COLONOSCOPY     SHOULDER ARTHROSCOPY  1998   rt   SHOULDER ARTHROSCOPY WITH SUBACROMIAL DECOMPRESSION  05/30/2005   right - with loose body exc., chondroplasty, partial labrum tear debridement   TRIGGER FINGER RELEASE  08/22/2012   Procedure: RELEASE TRIGGER FINGER/A-1 PULLEY;  Surgeon: Nicki Reaper, MD;  Location: [facility];  Service: Orthopedics;  Laterality: Right;   VEIN LIGATION  1978   left    No family history on file.  Social history:  reports current alcohol use. He reports that he does not use drugs. No history on file for tobacco use.  Medications:  Prior to Admission medications   Medication Sig Start Date End Date Taking? Authorizing Provider  amoxicillin-clavulanate (AUGMENTIN) 875-125 MG tablet Take 1 tablet by mouth every 12 (twelve) hours. 05/06/17   Lawyer, Cristal Deer, PA-C  aspirin 81 MG tablet Take 81 mg by mouth every evening.    [provider]  fish oil-omega-3 fatty acids 1000 MG capsule Take 2 g by mouth daily.    [provider]  HYDROcodone-acetaminophen (NORCO) 5-325 MG per tablet Take 1 tablet by mouth every 6 (six) hours as needed for pain. 08/22/12   Cindee Salt, MD  Multiple Vitamins-Minerals (MULTIVITAMIN WITH MINERALS) tablet Take 1 tablet by mouth every evening.    [provider]  omeprazole (PRILOSEC) 20 MG capsule Take 20 mg by mouth every evening.    [provider]  sertraline (ZOLOFT) 50 MG tablet Take 50 mg by mouth every evening.    [provider]  simvastatin (ZOCOR) 20 MG tablet Take 20 mg by mouth at bedtime.    [provider]  zolpidem (AMBIEN) 5 MG tablet  Take 5 mg by mouth at bedtime as needed.    [provider]     No Known Allergies  ROS:  Out of a complete 14 system review of symptoms, the patient complains only of the following symptoms, and all other reviewed systems are negative.  Walking problem Weakness Hand numbness  Blood pressure (!) 149/92, pulse 79, height 6\' 4"  (1.93 m), weight 219 lb 3.2 oz (99.4 kg).  Physical Exam  General: The patient is alert and cooperative at the time of the examination.  Eyes: Pupils are equal, round, and reactive to light. Discs are flat bilaterally.  Neck: The neck is supple, no carotid bruits are noted.  Respiratory: The  respiratory examination is clear.  Cardiovascular: The cardiovascular examination reveals a regular rate and rhythm, no obvious murmurs or rubs are noted.  Skin: Extremities are without significant edema.  Neurologic Exam  Mental status: The patient is alert and oriented x 3 at the time of the examination. The patient has apparent normal recent and remote memory, with an apparently normal attention span and concentration ability.  Cranial nerves: Facial symmetry is present. There is good sensation of the face to pinprick and soft touch bilaterally. The strength of the facial muscles and the muscles to head turning and shoulder shrug are normal bilaterally. Speech is well enunciated, no aphasia or dysarthria is noted. Extraocular movements are full. Visual fields are full. The tongue is midline, and the patient has symmetric elevation of the soft palate. No obvious hearing deficits are noted.  Motor: The motor testing reveals 5 over 5 strength of all 4 extremities, with exception of slight weakness of intrinsic muscles of the hands bilaterally and 4/5 strength with hip flexion bilaterally. Good symmetric motor tone is noted throughout.  Sensory: Sensory testing is intact to pinprick, soft touch, vibration sensation, and position sense on all 4 extremities. No evidence of extinction is noted.  Coordination: Cerebellar testing reveals good finger-nose-finger and heel-to-shin bilaterally.  Gait and station: Gait is wide-based, the patient can walk independently.  Tandem gait is minimally unsteady.  Romberg is negative.  The patient is able to arise from a squatting position without pushing off with arms.  He can walk on the heels and the toes bilaterally.  Reflexes: Deep tendon reflexes are symmetric and normal bilaterally, with exception of absence of the right ankle jerk.  The patient has 2-3 beats of ankle clonus on the left.  Toes are downgoing bilaterally.   MRI brain/ MRA head  03/23/20:  IMPRESSION: 1. Increased tortuosity of the optic nerves associated mild prominence of the bilateral superior ophthalmic veins may be related to increased intracranial pressure. Correlation with fundoscopic exam suggested. 2. Rare scattered foci of T2 hyperintensity within the white matter of the cerebral hemispheres, nonspecific. 3. Normal MRA of the head. 4. Paranasal sinus disease as described.   Assessment/Plan:  1.  Gait disturbance  2.  Bilateral hand numbness  The patient clearly has an abnormal gait pattern, he is walking with a wide-based stance.  The patient has had MRI of the brain done last year, no source of walking problems was identified.  The patient will be set up for MRI of the cervical spine and lumbar spine, he does have chronic low back pain.  The hand numbness could be consistent with carpal tunnel syndrome but also could be associated with a low-grade cervical myelopathy.  The patient will be sent for blood work today.  He will follow-up in 3 months.  Maurice Palau MD 03/29/2021 2:31 PM  [facility] [address] [place], [place] [address]  Phone [phone] Fax [phone]

## 2021-03-30 ENCOUNTER — Telehealth: Payer: Self-pay | Admitting: Neurology

## 2021-03-30 NOTE — Telephone Encounter (Addendum)
MRI cervical wo & lumbar wo auth: npr ref # [id number] Scheduled at [facility] 04/13/21 at 7:00 am.

## 2021-03-31 LAB — VITAMIN B12: Vitamin B-12: 1043 pg/mL (ref 232–1245)

## 2021-03-31 LAB — SEDIMENTATION RATE: Sed Rate: 2 mm/hr (ref 0–30)

## 2021-03-31 LAB — CK: Total CK: 88 U/L (ref 41–331)

## 2021-03-31 LAB — COPPER, SERUM: Copper: 102 ug/dL (ref 69–132)

## 2021-03-31 LAB — RPR: RPR Ser Ql: NONREACTIVE

## 2021-04-13 ENCOUNTER — Ambulatory Visit: Payer: 59

## 2021-04-13 DIAGNOSIS — R269 Unspecified abnormalities of gait and mobility: Secondary | ICD-10-CM | POA: Diagnosis not present

## 2021-04-13 DIAGNOSIS — G8929 Other chronic pain: Secondary | ICD-10-CM

## 2021-04-13 DIAGNOSIS — M545 Low back pain, unspecified: Secondary | ICD-10-CM

## 2021-04-14 ENCOUNTER — Telehealth: Payer: Self-pay | Admitting: Neurology

## 2021-04-14 DIAGNOSIS — R29898 Other symptoms and signs involving the musculoskeletal system: Secondary | ICD-10-CM

## 2021-04-14 NOTE — Telephone Encounter (Signed)
    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 - Overall similar to MRI from 07/28/17.

## 2021-04-14 NOTE — Telephone Encounter (Signed)
  I called the patient.  MRI of the cervical spine shows potential for nerve root impingement at C4-5 and C5-6 levels, but no evidence of spinal cord compression.  MRI of the lumbar spine has not yet been read out, but by my reading appears to be relatively unremarkable, there is certainly no evidence of any spinal stenosis or any significant nerve root impingement.  The etiology of the gait disturbance with this gentleman is not clear.  He has a wide-based unsteady gait.  I will check nerve conduction studies of both legs, EMG on 1 leg.  He does report weakness in both legs as if the legs will not support him.  CK enzyme levels previously were unremarkable.  MRI cervical 04/14/21:  IMPRESSION:   MRI cervical spine without contrast demonstrating: - At C4-5 uncovertebral joint and facet hypertrophy with severe bilateral foraminal stenosis. - At C5-6 disc bulging with severe right foraminal stenosis. - At C3-4 uncovertebral joint hypertrophy with moderate left foraminal stenosis.

## 2021-04-22 ENCOUNTER — Encounter (INDEPENDENT_AMBULATORY_CARE_PROVIDER_SITE_OTHER): Payer: 59 | Admitting: Diagnostic Neuroimaging

## 2021-04-22 ENCOUNTER — Ambulatory Visit (INDEPENDENT_AMBULATORY_CARE_PROVIDER_SITE_OTHER): Payer: 59 | Admitting: Diagnostic Neuroimaging

## 2021-04-22 DIAGNOSIS — R29898 Other symptoms and signs involving the musculoskeletal system: Secondary | ICD-10-CM | POA: Diagnosis not present

## 2021-04-22 DIAGNOSIS — Z0289 Encounter for other administrative examinations: Secondary | ICD-10-CM

## 2021-04-22 NOTE — Procedures (Signed)
[facility]  NCS (NERVE CONDUCTION STUDY) WITH EMG (ELECTROMYOGRAPHY) REPORT   STUDY DATE: 04/22/21 PATIENT NAME: [name] DOB: 1956-01-23 MRN: [id number]  ORDERING CLINICIAN: York Spaniel, MD   TECHNOLOGIST: Durenda Age ELECTROMYOGRAPHER: Glenford Bayley. Britta Louth, MD  CLINICAL INFORMATION: 65 year old male with gait difficulty.  FINDINGS: NERVE CONDUCTION STUDY:  Right peroneal motor response is normal.  Left peroneal motor response was absent.  Bilateral tibial motor responses abnormal distal latencies, decreased amplitudes and normal conduction velocities.  Bilateral sural and superficial peroneal sensory responses are normal.  Bilateral tibial F wave latencies are prolonged (greater than 69 m/s).    NEEDLE ELECTROMYOGRAPHY:  Needle examination of lumbar paraspinal muscles and lower extremities is notable for: -Right gluteus medius shows early recruitment of small polyphasic motor units. -Right lumbar paraspinal muscles, right iliopsoas, right vastus medialis, right tibialis anterior, right gastrocnemius, left tibialis anterior muscles are normal.   IMPRESSION:   Abnormal study demonstrating: -Abnormal motor responses noted in bilateral tibial and left peroneal motor nerves.  Prolonged F-wave latencies noted.  Normal sensory nerve responses.  Findings may be related to underlying motor neuropathy versus lumbar radiculopathies. -Needle examination unremarkable except for myopathic recruitment pattern in right gluteus medius.   INTERPRETING PHYSICIAN:  Suanne Marker, MD Certified in Neurology, Neurophysiology and Neuroimaging  [facility] [address] [place], [place] [address] [phone]  [facility]    Nerve / Sites Muscle Latency Ref. Amplitude Ref. Rel Amp Segments Distance Velocity Ref. Area    ms ms mV mV %  cm m/s m/s mVms  R Peroneal - EDB     Ankle EDB 5.4 ?6.5 2.5 ?2.0 100 Ankle - EDB 9   7.3      Fib head EDB 13.6  2.3  93 Fib head - Ankle 37 45 ?44 7.1     Pop fossa EDB 15.8  2.3  96.8 Pop fossa - Fib head 10 44 ?44 7.0         Pop fossa - Ankle      L Peroneal - EDB     Ankle EDB NR ?6.5 NR ?2.0 NR Ankle - EDB 9   NR     Fib head EDB NR  NR  NR Fib head - Ankle 36 NR ?44 NR     Pop fossa EDB NR  NR  NR Pop fossa - Fib head 10 NR ?44 NR         Pop fossa - Ankle      R Tibial - AH     Ankle AH 5.0 ?5.8 1.4 ?4.0 100 Ankle - AH 9   4.2     Pop fossa AH 19.6  0.7  51.1 Pop fossa - Ankle 50 34 ?41 3.5  L Tibial - AH     Ankle AH 5.3 ?5.8 1.3 ?4.0 100 Ankle - AH 9   4.0     Pop fossa AH 18.5  0.7  56.7 Pop fossa - Ankle 49 37 ?41 3.5             SNC    Nerve / Sites Rec. Site Peak Lat Ref.  Amp Ref. Segments Distance    ms ms V V  cm  R Sural - Ankle (Calf)     Calf Ankle 4.0 ?4.4 6 ?6 Calf - Ankle 14  L Sural - Ankle (Calf)     Calf Ankle 4.1 ?4.4 9 ?6 Calf - Ankle 14  R Superficial peroneal - Ankle     Lat leg Ankle 3.0 ?4.4 6 ?6 Lat leg - Ankle 14  L Superficial peroneal - Ankle     Lat leg Ankle 2.4 ?4.4 6 ?6 Lat leg - Ankle 14             F  Wave    Nerve F Lat Ref.   ms ms  R Tibial - AH 69.7 ?56.0  L Tibial - AH 69.4 ?56.0         EMG Summary Table    Spontaneous MUAP Recruitment  Muscle IA Fib PSW Fasc Other Amp Dur. Poly Pattern  R. Vastus medialis Normal None None None _______ Normal Normal Normal Normal  R. Tibialis anterior Normal None None None _______ Normal Normal Normal Normal  R. Gastrocnemius (Medial head) Normal None None None _______ Normal Normal Normal Normal  L. Tibialis anterior Normal None None None _______ Normal Normal Normal Normal  R. Iliopsoas Normal None None None _______ Normal Normal Normal Normal  R. Gluteus medius Normal None None None _______ Decreased Increased 1+ Early  R. Lumbar paraspinals Normal None None None _______ Normal Normal Normal Normal

## 2021-04-26 ENCOUNTER — Telehealth: Payer: Self-pay | Admitting: Neurology

## 2021-04-26 DIAGNOSIS — G609 Hereditary and idiopathic neuropathy, unspecified: Secondary | ICD-10-CM

## 2021-04-26 NOTE — Telephone Encounter (Signed)
I called the patient.  EMG and nerve conduction study showed motor slowing of the nerves in the legs.  MRI of the lumbar spine has not shown any spinal stenosis.  We will check further blood work looking for source of the neuropathy.

## 2021-05-13 ENCOUNTER — Other Ambulatory Visit (INDEPENDENT_AMBULATORY_CARE_PROVIDER_SITE_OTHER): Payer: Self-pay

## 2021-05-13 DIAGNOSIS — G609 Hereditary and idiopathic neuropathy, unspecified: Secondary | ICD-10-CM

## 2021-05-13 DIAGNOSIS — Z0289 Encounter for other administrative examinations: Secondary | ICD-10-CM

## 2021-05-17 LAB — MULTIPLE MYELOMA PANEL, SERUM
Albumin SerPl Elph-Mcnc: 4.3 g/dL (ref 2.9–4.4)
Albumin/Glob SerPl: 1.5 (ref 0.7–1.7)
Alpha 1: 0.2 g/dL (ref 0.0–0.4)
Alpha2 Glob SerPl Elph-Mcnc: 0.6 g/dL (ref 0.4–1.0)
B-Globulin SerPl Elph-Mcnc: 1.2 g/dL (ref 0.7–1.3)
Gamma Glob SerPl Elph-Mcnc: 1 g/dL (ref 0.4–1.8)
Globulin, Total: 3 g/dL (ref 2.2–3.9)
IgA/Immunoglobulin A, Serum: 296 mg/dL (ref 61–437)
IgG (Immunoglobin G), Serum: 957 mg/dL (ref 603–1613)
IgM (Immunoglobulin M), Srm: 56 mg/dL (ref 20–172)
Total Protein: 7.3 g/dL (ref 6.0–8.5)

## 2021-05-17 LAB — ANA W/REFLEX: Anti Nuclear Antibody (ANA): NEGATIVE

## 2021-05-17 LAB — LYME DISEASE SEROLOGY W/REFLEX: Lyme Total Antibody EIA: NEGATIVE

## 2021-05-17 LAB — ANGIOTENSIN CONVERTING ENZYME: Angio Convert Enzyme: 33 U/L (ref 14–82)

## 2021-05-17 LAB — HTLV I+II ANTIBODIES, (EIA), BLD: HTLV I/II Ab: NEGATIVE

## 2022-02-15 ENCOUNTER — Other Ambulatory Visit: Payer: Self-pay | Admitting: Family Medicine

## 2022-02-15 DIAGNOSIS — G8929 Other chronic pain: Secondary | ICD-10-CM

## 2022-02-15 DIAGNOSIS — G629 Polyneuropathy, unspecified: Secondary | ICD-10-CM

## 2022-02-15 DIAGNOSIS — R2681 Unsteadiness on feet: Secondary | ICD-10-CM

## 2022-02-18 ENCOUNTER — Encounter: Payer: Self-pay | Admitting: Physical Therapy

## 2022-02-18 ENCOUNTER — Ambulatory Visit (INDEPENDENT_AMBULATORY_CARE_PROVIDER_SITE_OTHER): Payer: 59 | Admitting: Physical Therapy

## 2022-02-18 DIAGNOSIS — M6281 Muscle weakness (generalized): Secondary | ICD-10-CM

## 2022-02-18 DIAGNOSIS — R2689 Other abnormalities of gait and mobility: Secondary | ICD-10-CM | POA: Diagnosis not present

## 2022-02-18 NOTE — Therapy (Signed)
?OUTPATIENT PHYSICAL THERAPY EVALUATION ? ? ?Patient Name: [name] ?MRN: [id number] ?DOB:15-Oct-1955, 66 y.o., male ?Today's Date: 02/18/2022 ? ? PT End of Session - 02/21/22 2029   ? ? Visit Number 1   ? Number of Visits 16   ? Date for PT Re-Evaluation 04/15/22   ? Authorization Type UHC   ? PT Start Time 0930   ? PT Stop Time 1010   ? PT Time Calculation (min) 40 min   ? Equipment Utilized During Treatment Gait belt   ? Activity Tolerance Patient tolerated treatment well   ? [facility] for tasks assessed/performed   ? ?  ?  ? ?  ? ? ?Past Medical History:  ?Diagnosis Date  ? Anxiety   ? Arthritis   ? Blurred vision   ? Depression   ? GERD (gastroesophageal reflux disease)   ? Hyperlipemia   ? Palpitations   ? PONV (postoperative nausea and vomiting)   ? ?Past Surgical History:  ?Procedure Laterality Date  ? CHOLECYSTECTOMY  11/18/2008  ? lap. chole.  ? COLONOSCOPY    ? SHOULDER ARTHROSCOPY  1998  ? rt  ? SHOULDER ARTHROSCOPY WITH SUBACROMIAL DECOMPRESSION  05/30/2005  ? right - with loose body exc., chondroplasty, partial labrum tear debridement  ? TRIGGER FINGER RELEASE  08/22/2012  ? Procedure: RELEASE TRIGGER FINGER/A-1 PULLEY;  Surgeon: Nicki ReaperGary R Kuzma, MD;  Location: [facility];  Service: Orthopedics;  Laterality: Right;  ? VEIN LIGATION  1978  ? left  ? ?There are no problems to display for this patient. ? ? ?PCP: Shelly Flattenavid Merrell ? ?REFERRING PROVIDER: Shelly Flattenavid Merrell ? ?REFERRING DIAG: Gait Instability, Neuropathy ? ?THERAPY DIAG:  ?Other abnormalities of gait and mobility ? ?Muscle weakness (generalized) ? ?ONSET DATE:  ? ?SUBJECTIVE:                                                                                                                                                                                          ? ?SUBJECTIVE STATEMENT: ? ?Pt states worsening neuropathy , present for about 3-4 years. He had recent nerve conduction test from Neurologist. He works full time at  [employer], lots of travel, walking in fields, outside at times.  ?Thinks balance is worsening has had 3-4 falls and several near falls. Going down stairs hard, has fallen when descending. He is doing less physical activity due to fear of falling. States leg weakness ?Also has low back pain, low level, constant, up to 4/10, more in AM.  ?Also has L knee pain, having MRI in a couple weeks.  ? ? ? ?PERTINENT HISTORY:  ?Neuropathy,  LBP, L knee pain,  ? ? ?PAIN:  ?Are you having pain? Yes: NPRS scale: 5-6/10 ?Pain location: L knee ?Pain description: Lambert Mody ?Aggravating factors: stairs  ?Relieving factors: none stated ? ?Are you having pain? Yes: NPRS scale: 4-5/10 ?Pain location: Low back ?Pain description: achey, constant ?Aggravating factors: 1st thing in am, getting up from sitting, bending ?Relieving factors: none stated ? ?Are you having pain? Yes: NPRS scale: 2-3/10 ?Pain location: bil feet and lower legs  ?Pain description: Numbness ?Aggravating factors: increased standing activity  ?Relieving factors: none stated ? ? ?PRECAUTIONS: Fall ? ?WEIGHT BEARING RESTRICTIONS No ? ?FALLS:  ?Has patient fallen in last 6 months? Yes. Number of falls 2.                   4 in last year ? ?LIVING ENVIRONMENT: ?Lives with: lives with their family ?Lives in: House/apartment ?Stairs: Yes: External: 4 steps; rails: yes ?Has following equipment at home: None ? ?OCCUPATION: Working full time at [employer], travels a lot, walking through airports, walking around [employer], little seated desk time.  ? ?PLOF: Independent ? ?PATIENT GOALS  Improve balance, stability and strength in legs ? ? ?OBJECTIVE:  ? ?DIAGNOSTIC FINDINGS:  ?Recent nerve conduction test ?Lumbar MRI:  ?Head MRI:  ? ? ?COGNITION: ? Overall cognitive status: Within functional limits for tasks assessed  ? ? ?PALPATION: ? ? ?LUMBAR ROM:  ? ?Active  AROM ?02/18/2022  ?Flexion 14 in from floor   ?Extension Mild limitation/soreness  ?Right lateral flexion Min limitation   ?Left lateral flexion Min limitation  ?Right rotation   ?Left rotation   ? (Blank rows = not tested) ? ?LE ROM: ?Hips: WFL ? ?LE MMT: ?Bil Hips: 4/5, Knee ext: 4/5,  Flex: 4+/5,  Ankle: 4/5, Gr toe ext 4- bil;  ? ? ?LUMBAR SPECIAL TESTS:  ?Neg SLR ? ? ?GAIT: ?Distance walked: 50 ft ?Assistive device utilized: None ?Level of assistance: Complete Independence ?Comments: L LE decreased coordination and variable step width/length.  ? ? [facility] PT Assessment - 02/21/22 0001   ? ?  ? Standardized Balance Assessment  ? Standardized Balance Assessment Berg Balance Test;Five Times Sit to Stand   ?  ? Berg Balance Test  ? Sit to Stand Able to stand  independently using hands   ? Standing Unsupported Able to stand safely 2 minutes   ? Sitting with Back Unsupported but Feet Supported on Floor or Stool Able to sit safely and securely 2 minutes   ? Stand to Sit Controls descent by using hands   ? Transfers Able to transfer safely, definite need of hands   ? Standing Unsupported with Eyes Closed Able to stand 10 seconds with supervision   ? Standing Unsupported with Feet Together Able to place feet together independently and stand for 1 minute with supervision   ? From Standing, Reach Forward with Outstretched Arm Can reach forward >12 cm safely (5")   ? From Standing Position, Pick up Object from Floor Able to pick up shoe safely and easily   ? From Standing Position, Turn to Look Behind Over each Shoulder Looks behind from both sides and weight shifts well   ? Turn 360 Degrees Able to turn 360 degrees safely in 4 seconds or less   ? Standing Unsupported, Alternately Place Feet on Step/Stool Able to stand independently and complete 8 steps >20 seconds   ? Standing Unsupported, One Foot in Front Able to take small step independently and hold 30 seconds   ?  Standing on One Leg Tries to lift leg/unable to hold 3 seconds but remains standing independently   ? Total Score 44   ? ?  ?  ? ?  ? ? ? ? ? ?TODAY'S TREATMENT  ? ? ?PATIENT  EDUCATION:  ?Education details: PT POC, Exam findings, Balance, safety ?Person educated: Patient ?Education method: Explanation, Demonstration, Tactile cues, and Verbal cues ?Education comprehension: verbalized understanding, returned demonstration, verbal cues required, tactile cues required, and needs further education ? ? ?HOME EXERCISE PROGRAM: ?Access Code: [email or web address] ?URL: [email or web address] ?Prepared by: Sedalia Muta ? ? ? ? ?ASSESSMENT: ? ?CLINICAL IMPRESSION: ?Pt presents with primary complaint of balance deficits from worsening neuropathy in bil lower legs. He has altered sensation in bil feet, and pain. He has decreased ankle  and toe mobility with joint stiffness and muscle tightness. He has decreased strength of LEs, and decreased stability with dynamic activity. He has noted decreased coordination with L LE with gait, and has had decreased stability with ambulation. Pt with decreased ability for full functional activities and work duties due to instability. Pt to benefit from skilled PT to improve deficits  and pain.  ? ? ?OBJECTIVE IMPAIRMENTS Abnormal gait, decreased activity tolerance, decreased balance, decreased coordination, decreased knowledge of use of DME, decreased mobility, difficulty walking, decreased strength, increased muscle spasms, impaired flexibility, impaired sensation, improper body mechanics, and pain.  ? ?ACTIVITY LIMITATIONS cleaning, community activity, driving, occupation, laundry, yard work, and shopping.  ? ?PERSONAL FACTORS 1 comorbidity: Neuropathy  are also affecting patient's functional outcome.  ? ? ?REHAB POTENTIAL: Good ? ?CLINICAL DECISION MAKING: Stable/uncomplicated ? ?EVALUATION COMPLEXITY: Low ? ? ?GOALS: ?Goals reviewed with patient? Yes ? ?SHORT TERM GOALS: Target date: 03/04/22 ? ?Patient to be independent with initial HEP ? ?Goal status: INITIAL ? ?2.  Patient to report decreased pain to 0-2/10  ? ?Goal status: INITIAL ? ? ? ? ?LONG TERM  GOALS: Target date: 04/15/22 ? ?Patient to be independent with final HEP ? ?Goal status: INITIAL ? ?2.  Patient to demo improved lumbar flexion ROM , to be at least 9 in from ground (improve by 5 inches)  and pain f

## 2022-02-20 ENCOUNTER — Encounter: Payer: Self-pay | Admitting: Physical Therapy

## 2022-02-21 ENCOUNTER — Ambulatory Visit (INDEPENDENT_AMBULATORY_CARE_PROVIDER_SITE_OTHER): Payer: 59 | Admitting: Physical Therapy

## 2022-02-21 ENCOUNTER — Encounter: Payer: Self-pay | Admitting: Physical Therapy

## 2022-02-21 DIAGNOSIS — R2689 Other abnormalities of gait and mobility: Secondary | ICD-10-CM | POA: Diagnosis not present

## 2022-02-21 DIAGNOSIS — M6281 Muscle weakness (generalized): Secondary | ICD-10-CM | POA: Diagnosis not present

## 2022-02-21 NOTE — Therapy (Signed)
?OUTPATIENT PHYSICAL THERAPY TREATMENT  ? ? ?Patient Name: [name] ?MRN: [id number] ?DOB:1956/04/07, 66 y.o., male ?Today's Date: 02/21/2022 ? ? PT End of Session - 02/21/22 2108   ? ? Visit Number 2   ? Number of Visits 16   ? Date for PT Re-Evaluation 04/15/22   ? Authorization Type UHC   ? PT Start Time 1350   ? PT Stop Time 1430   ? PT Time Calculation (min) 40 min   ? Equipment Utilized During Treatment Gait belt   ? Activity Tolerance Patient tolerated treatment well   ? Behavior During Therapy [facility] for tasks assessed/performed   ? ?  ?  ? ?  ? ? ?Past Medical History:  ?Diagnosis Date  ? Anxiety   ? Arthritis   ? Blurred vision   ? Depression   ? GERD (gastroesophageal reflux disease)   ? Hyperlipemia   ? Palpitations   ? PONV (postoperative nausea and vomiting)   ? ?Past Surgical History:  ?Procedure Laterality Date  ? CHOLECYSTECTOMY  11/18/2008  ? lap. chole.  ? COLONOSCOPY    ? SHOULDER ARTHROSCOPY  1998  ? rt  ? SHOULDER ARTHROSCOPY WITH SUBACROMIAL DECOMPRESSION  05/30/2005  ? right - with loose body exc., chondroplasty, partial labrum tear debridement  ? TRIGGER FINGER RELEASE  08/22/2012  ? Procedure: RELEASE TRIGGER FINGER/A-1 PULLEY;  Surgeon: Nicki Reaper, MD;  Location: [facility];  Service: Orthopedics;  Laterality: Right;  ? VEIN LIGATION  1978  ? left  ? ?There are no problems to display for this patient. ? ? ?PCP: Shelly Flatten ? ?REFERRING PROVIDER: Shelly Flatten ? ?REFERRING DIAG: Gait Instability, Neuropathy ? ?THERAPY DIAG:  ?Other abnormalities of gait and mobility ? ?Muscle weakness (generalized) ? ?ONSET DATE:  ? ?SUBJECTIVE:                                                                                                                                                                                          ? ?SUBJECTIVE STATEMENT: ?02/21/2022 ?Pt with no new complaints.  ? ?Eval: Pt states worsening neuropathy , present for about 3-4 years. He had recent nerve conduction  test from Neurologist. He works full time at [employer], lots of travel, walking in fields, outside at times.  ?Thinks balance is worsening has had 3-4 falls and several near falls. Going down stairs hard, has fallen when descending. He is doing less physical activity due to fear of falling. States leg weakness ?Also has low back pain, low level, constant, up to 4/10, more in AM.  ?Also has L knee pain, having MRI in a couple  weeks.  ? ? ? ?PERTINENT HISTORY:  ?Neuropathy, LBP, L knee pain,  ? ? ?PAIN:  ?Are you having pain? Yes: NPRS scale: 5-6/10 ?Pain location: L knee ?Pain description: Lambert Mody ?Aggravating factors: stairs  ?Relieving factors: none stated ? ?Are you having pain? Yes: NPRS scale: 4-5/10 ?Pain location: Low back ?Pain description: achey, constant ?Aggravating factors: 1st thing in am, getting up from sitting, bending ?Relieving factors: none stated ? ?Are you having pain? Yes: NPRS scale: 2-3/10 ?Pain location: bil feet and lower legs  ?Pain description: Numbness ?Aggravating factors: increased standing activity  ?Relieving factors: none stated ? ? ?PRECAUTIONS: Fall ? ?WEIGHT BEARING RESTRICTIONS No ? ?FALLS:  ?Has patient fallen in last 6 months? Yes. Number of falls 2.                   4 in last year ? ?LIVING ENVIRONMENT: ?Lives with: lives with their family ?Lives in: House/apartment ?Stairs: Yes: External: 4 steps; rails: yes ?Has following equipment at home: None ? ?OCCUPATION: Working full time at [employer], travels a lot, walking through airports, walking around [employer], little seated desk time.  ? ?PLOF: Independent ? ?PATIENT GOALS  Improve balance, stability and strength in legs ? ? ?OBJECTIVE:  ? ?DIAGNOSTIC FINDINGS:  ?Recent nerve conduction test ?Lumbar MRI:  ?Head MRI:  ? ? ?COGNITION: ? Overall cognitive status: Within functional limits for tasks assessed  ? ? ?PALPATION: ?Significant stiffness for Ankle DF bil and EV on R; stiffness in rearfoot and mid foot, as well as  stiffness in toes. Tightness in bil HS,  ? ?LUMBAR ROM:  ? ?Active  AROM ?02/18/2022  ?Flexion 14 in from floor   ?Extension Mild limitation/soreness  ?Right lateral flexion Min limitation  ?Left lateral flexion Min limitation  ?Right rotation   ?Left rotation   ? (Blank rows = not tested) ? ?LE ROM: ?Hips: WFL ? ?LE MMT: ?Bil Hips: 4/5, Knee ext: 4/5,  Flex: 4+/5,  Ankle: 4/5, Gr toe ext 4- bil;  ? ? ?LUMBAR SPECIAL TESTS:  ?Neg SLR ? ? ?GAIT: ?Distance walked: 50 ft ?Assistive device utilized: None ?Level of assistance: Complete Independence ?Comments: L LE decreased coordination and variable step width/length.  ? ? ? ? ?TODAY'S TREATMENT  ? ?02/21/22 ? ?Therapeutic Exercise: ?Aerobic: ?Supine:  ?Seated:  Sit to stand x 10;  ?Standing:  Squats x 10;  Heel raises x 10 ;  ?Stretches:  Gastroc stretch at wall 30 sec x 3 bil;  ?Neuromuscular Re-education:  ?L/R and A/P weight shifts x 20 ea; staggered stance weight shifts x 10 bil;  ?Manual Therapy:  Mobilization to inc bil anke DF and EV on R, mobilization for mets/midfoot, PROM for bil Gr toe flex and ext;   ?Self Care: ? ? ? ?PATIENT EDUCATION:  ?Education details: Reviewed and updated HEP, discussed increasing frequency of foot/ankle ROM and stretching.  ?Person educated: Patient ?Education method: Explanation, Demonstration, Tactile cues, and Verbal cues ?Education comprehension: verbalized understanding, returned demonstration, verbal cues required, tactile cues required, and needs further education ? ? ?HOME EXERCISE PROGRAM: ?Access Code: [email or web address] ?URL: [email or web address] ?Prepared by: Sedalia Muta ? ? ? ? ?ASSESSMENT: ? ?CLINICAL IMPRESSION: ? 02/21/2022 ? Focus on education today for ankle mobility, ROM, and stretching, updated HEP to include. Started weight shifts for all directions. Pt with noted foot and ankle stiffness and will benefit from continued mobilization and education on HEP for this. Plan to progress balance and stability as  tolerated.  ? ? ?OBJECTIVE IMPAIRMENTS  Abnormal gait, decreased activity tolerance, decreased balance, decreased coordination, decreased knowledge of use of DME, decreased mobility, difficulty walking, decreased strength, increased muscle spasms, impaired flexibility, impaired sensation, improper body mechanics, and pain.  ? ?ACTIVITY LIMITATIONS cleaning, community activity, driving, occupation, laundry, yard work, and shopping.  ? ?PERSONAL FACTORS 1 comorbidity: Neuropathy  are also affecting patient's functional outcome.  ? ? ?REHAB POTENTIAL: Good ? ?CLINICAL DECISION MAKING: Stable/uncomplicated ? ?EVALUATION COMPLEXITY: Low ? ? ?GOALS: ?Goals reviewed with patient? Yes ? ?SHORT TERM GOALS: Target date: 03/04/22 ? ?Patient to be independent with initial HEP ? ?Goal status: INITIAL ? ?2.  Patient to report decreased pain to 0-2/10  ? ?Goal status: INITIAL ? ? ? ? ?LONG TERM GOALS: Target date: 04/15/22 ? ?Patient to be independent with final HEP ? ?Goal status: INITIAL ? ?2.  Patient to demo improved lumbar flexion ROM , to be at least 9 in from ground (improve by 5 inches)  and pain free, to improve ability for ADLs and IADLs.  ? ?Goal status: INITIAL ? ?3.  Patient to demo improved strength of bil hips to at least 4+/5, and knees to 5/5 ,  to improve stability and stair ability.  ? ?Goal status: INITIAL ? ?4.  Patient to improve on BERG by at least 5 points ? ?Goal status: INITIAL ? ?5.  Pt to improve on DGI- TBD ? ?Goal status: INITIAL ? ?6. Pt to demo ability for independent/safe ability for stair navigation with 1 or less hand rail, reciprocal pattern, to improve ability for work and community activities.   ? ? ? ? ?PLAN: ?PT FREQUENCY: 2x/week ? ?PT DURATION: 8 weeks ? ?PLANNED INTERVENTIONS: Therapeutic exercises, Therapeutic activity, Neuromuscular re-education, Balance training, Gait training, Patient/Family education, Joint manipulation, Joint mobilization, Stair training, DME instructions, Dry Needling,  Electrical stimulation, Spinal manipulation, Spinal mobilization, Cryotherapy, Moist heat, Taping, Traction, Ionotophoresis 4mg /ml Dexamethasone, and Manual therapy. ? ?PLAN FOR NEXT SESSION:  Static balance,

## 2022-02-24 ENCOUNTER — Ambulatory Visit (INDEPENDENT_AMBULATORY_CARE_PROVIDER_SITE_OTHER): Payer: 59 | Admitting: Physical Therapy

## 2022-02-24 ENCOUNTER — Encounter: Payer: Self-pay | Admitting: Physical Therapy

## 2022-02-24 DIAGNOSIS — R2689 Other abnormalities of gait and mobility: Secondary | ICD-10-CM | POA: Diagnosis not present

## 2022-02-24 DIAGNOSIS — M6281 Muscle weakness (generalized): Secondary | ICD-10-CM

## 2022-02-24 NOTE — Therapy (Signed)
OUTPATIENT PHYSICAL THERAPY TREATMENT    Patient Name: [name] MRN: [id number] DOB:[birth year], 66 y.o., male Today's Date: 02/21/2022   PT End of Session - 02/24/22 1432     Visit Number 3    Number of Visits 16    Date for PT Re-Evaluation 04/15/22    Authorization Type [employer]    PT Start Time 1433    PT Stop Time 1513    PT Time Calculation (min) 40 min    Equipment Utilized During Treatment Gait belt    Activity Tolerance Patient tolerated treatment well    Behavior During Therapy WFL for tasks assessed/performed             Past Medical History:  Diagnosis Date   Anxiety    Arthritis    Blurred vision    Depression    GERD (gastroesophageal reflux disease)    Hyperlipemia    Palpitations    PONV (postoperative nausea and vomiting)    Past Surgical History:  Procedure Laterality Date   CHOLECYSTECTOMY  11/18/2008   lap. chole.   COLONOSCOPY     SHOULDER ARTHROSCOPY  1998   rt   SHOULDER ARTHROSCOPY WITH SUBACROMIAL DECOMPRESSION  05/30/2005   right - with loose body exc., chondroplasty, partial labrum tear debridement   TRIGGER FINGER RELEASE  08/22/2012   Procedure: RELEASE TRIGGER FINGER/A-1 PULLEY;  Surgeon: Nicki Reaper, MD;  Location: [facility];  Service: Orthopedics;  Laterality: Right;   VEIN LIGATION  1978   left   There are no problems to display for this patient.   PCP: Shelly Flatten  REFERRING PROVIDER: Shelly Flatten  REFERRING DIAG: Gait Instability, Neuropathy  THERAPY DIAG:  Other abnormalities of gait and mobility  Muscle weakness (generalized)  ONSET DATE:   SUBJECTIVE:                                                                                                                                                                                           SUBJECTIVE STATEMENT: 02/24/2022 Pt with no new complaints. Has same pains.   Eval: Pt states worsening neuropathy , present for about 3-4 years. He had recent  nerve conduction test from Neurologist. He works full time at [employer], lots of travel, walking in fields, outside at times.  Thinks balance is worsening has had 3-4 falls and several near falls. Going down stairs hard, has fallen when descending. He is doing less physical activity due to fear of falling. States leg weakness Also has low back pain, low level, constant, up to 4/10, more in AM.  Also has L knee pain, having MRI  in a couple weeks.     PERTINENT HISTORY:  Neuropathy, LBP, L knee pain,    PAIN:  Are you having pain? Yes: NPRS scale: 5-6/10 Pain location: L knee Pain description: Sharp Aggravating factors: stairs  Relieving factors: none stated  Are you having pain? Yes: NPRS scale: 4-5/10 Pain location: Low back Pain description: achey, constant Aggravating factors: 1st thing in am, getting up from sitting, bending Relieving factors: none stated  Are you having pain? Yes: NPRS scale: 2-3/10 Pain location: bil feet and lower legs  Pain description: Numbness Aggravating factors: increased standing activity  Relieving factors: none stated   PRECAUTIONS: Fall  WEIGHT BEARING RESTRICTIONS No  FALLS:  Has patient fallen in last 6 months? Yes. Number of falls 2.                   4 in last year  LIVING ENVIRONMENT: Lives with: lives with their family Lives in: House/apartment Stairs: Yes: External: 4 steps; rails: yes Has following equipment at home: None  OCCUPATION: Working full time at [employer], travels a lot, walking through airports, walking around [employer], little seated desk time.   PLOF: Independent  PATIENT GOALS  Improve balance, stability and strength in legs   OBJECTIVE:   DIAGNOSTIC FINDINGS:  Recent nerve conduction test Lumbar MRI:  Head MRI:    COGNITION:  Overall cognitive status: Within functional limits for tasks assessed    PALPATION: Significant stiffness for Ankle DF bil and EV on R; stiffness in rearfoot and mid foot,  as well as stiffness in toes. Tightness in bil HS,   LUMBAR ROM:   Active  AROM 02/18/2022  Flexion 14 in from floor   Extension Mild limitation/soreness  Right lateral flexion Min limitation  Left lateral flexion Min limitation  Right rotation   Left rotation    (Blank rows = not tested)  LE ROM: Hips: WFL  LE MMT: Bil Hips: 4/5, Knee ext: 4/5,  Flex: 4+/5,  Ankle: 4/5, Gr toe ext 4- bil;    LUMBAR SPECIAL TESTS:  Neg SLR   Balance 02/24/22  On foam narrow BOS EO 30" mild sway weight on toes/bouncing, EC on foam 23 seconds moderate sway weight on toes/bouncing  GAIT: Distance walked: 50 ft Assistive device utilized: None Level of assistance: Complete Independence Comments: L LE decreased coordination and variable step width/length.    [facility] PT Assessment - 02/24/22 0001       Standardized Balance Assessment   Standardized Balance Assessment Dynamic Gait Index      Dynamic Gait Index   Level Surface Normal    Change in Gait Speed Normal    Gait with Horizontal Head Turns Mild Impairment    Gait with Vertical Head Turns Mild Impairment    Gait and Pivot Turn Mild Impairment    Step Over Obstacle Normal    Step Around Obstacles Normal    Steps Normal    Total Score 21    DGI comment: wide base of suppport and reports of uneasiness with certain motions.              TODAY'S TREATMENT   02/24/2022   Therapeutic Exercise: Aerobic: Supine:   Neuromuscular Re-education:  Narrow BOS on floor x3 30" holds EO --> EC  Seated with bare feet on pad with vibration from theragun - 10 minutes - to improve proprioceptive input via vibration pathway. Narrow BOS on floor x3 30" EO --> EC, x2 max attempts EO on foam  Manual Therapy:   Self Care:  Previous interventions: Seated:  Sit to stand x 10;  Standing:  Squats x 10;  Heel raises x 10 ;  Stretches:  Gastroc stretch at wall 30 sec x 3 bil;    PATIENT EDUCATION:  Education details: on rationale for  vibration, anatomy and current presentation., on outcomes scores Person educated: Patient Education method: Explanation, Demonstration, Tactile cues, and Verbal cues Education comprehension: verbalized understanding, returned demonstration, verbal cues required, tactile cues required, and needs further education   HOME EXERCISE PROGRAM: Access Code: [email or web address] URL: [email or web address] Prepared by: Sedalia MutaLauren Carroll     ASSESSMENT:  CLINICAL IMPRESSION:  02/24/2022  Performed on DGI with minimal impairments noted though self selected walking mechanics is stiff wide base of support gait style. Assessed balance on foam both with eyes open and closed and patient with weight on toes which results in bouncing motion. When cued to shift weight backwards patient becomes unstable and almost looses balance. Trailed vibration today to help with proprioceptive awareness in feet. Returned to balance exercises and significant improvement noted with improved ankle strategy in all directions. Patient no longer rested on toes but instead utilized the entire foot to balance without loss of balance. Educated patient on findings and plan moving forward.    OBJECTIVE IMPAIRMENTS Abnormal gait, decreased activity tolerance, decreased balance, decreased coordination, decreased knowledge of use of DME, decreased mobility, difficulty walking, decreased strength, increased muscle spasms, impaired flexibility, impaired sensation, improper body mechanics, and pain.   ACTIVITY LIMITATIONS cleaning, community activity, driving, occupation, Pharmacologistlaundry, yard work, and shopping.   PERSONAL FACTORS 1 comorbidity: Neuropathy  are also affecting patient's functional outcome.    REHAB POTENTIAL: Good  CLINICAL DECISION MAKING: Stable/uncomplicated  EVALUATION COMPLEXITY: Low   GOALS: Goals reviewed with patient? Yes  SHORT TERM GOALS: Target date: 03/04/22  Patient to be independent with initial HEP  Goal  status: INITIAL  2.  Patient to report decreased pain to 0-2/10   Goal status: INITIAL     LONG TERM GOALS: Target date: 04/15/22  Patient to be independent with final HEP  Goal status: INITIAL  2.  Patient to demo improved lumbar flexion ROM , to be at least 9 in from ground (improve by 5 inches)  and pain free, to improve ability for ADLs and IADLs.   Goal status: INITIAL  3.  Patient to demo improved strength of bil hips to at least 4+/5, and knees to 5/5 ,  to improve stability and stair ability.   Goal status: INITIAL  4.  Patient to improve on BERG by at least 5 points  Goal status: INITIAL  5.  Pt to improve on DGI- TBD  Goal status: INITIAL  6. Pt to demo ability for independent/safe ability for stair navigation with 1 or less hand rail, reciprocal pattern, to improve ability for work and community activities.       PLAN: PT FREQUENCY: 2x/week  PT DURATION: 8 weeks  PLANNED INTERVENTIONS: Therapeutic exercises, Therapeutic activity, Neuromuscular re-education, Balance training, Gait training, Patient/Family education, Joint manipulation, Joint mobilization, Stair training, DME instructions, Dry Needling, Electrical stimulation, Spinal manipulation, Spinal mobilization, Cryotherapy, Moist heat, Taping, Traction, Ionotophoresis 4mg /ml Dexamethasone, and Manual therapy.  PLAN FOR NEXT SESSION:  Static balance, weight shifts, LE strength, Sit to stands, Heel raises.    3:38 PM, 02/24/22 Tereasa CoopMichele Jacky Dross, DPT [facility]

## 2022-02-26 ENCOUNTER — Ambulatory Visit
Admission: RE | Admit: 2022-02-26 | Discharge: 2022-02-26 | Disposition: A | Discharge status: Home / Self Care | Payer: 59 | Source: Ambulatory Visit | Attending: Family Medicine | Admitting: Family Medicine

## 2022-02-26 DIAGNOSIS — G8929 Other chronic pain: Secondary | ICD-10-CM

## 2022-03-08 ENCOUNTER — Ambulatory Visit (INDEPENDENT_AMBULATORY_CARE_PROVIDER_SITE_OTHER): Payer: 59 | Admitting: Physical Therapy

## 2022-03-08 ENCOUNTER — Encounter: Payer: Self-pay | Admitting: Physical Therapy

## 2022-03-08 DIAGNOSIS — M6281 Muscle weakness (generalized): Secondary | ICD-10-CM | POA: Diagnosis not present

## 2022-03-08 DIAGNOSIS — R2689 Other abnormalities of gait and mobility: Secondary | ICD-10-CM

## 2022-03-08 NOTE — Therapy (Signed)
OUTPATIENT PHYSICAL THERAPY TREATMENT    Patient Name: [name] MRN: [id number] DOB:[birth year], 66 y.o., male Today's Date: 03/08/2022    PT End of Session - 03/08/22 1215     Visit Number 4    Number of Visits 16    Date for PT Re-Evaluation 04/15/22    Authorization Type UHC    PT Start Time 1218    PT Stop Time 1258    PT Time Calculation (min) 40 min    Equipment Utilized During Treatment Gait belt    Activity Tolerance Patient tolerated treatment well    Behavior During Therapy WFL for tasks assessed/performed             Past Medical History:  Diagnosis Date   Anxiety    Arthritis    Blurred vision    Depression    GERD (gastroesophageal reflux disease)    Hyperlipemia    Palpitations    PONV (postoperative nausea and vomiting)    Past Surgical History:  Procedure Laterality Date   CHOLECYSTECTOMY  11/18/2008   lap. chole.   COLONOSCOPY     SHOULDER ARTHROSCOPY  1998   rt   SHOULDER ARTHROSCOPY WITH SUBACROMIAL DECOMPRESSION  05/30/2005   right - with loose body exc., chondroplasty, partial labrum tear debridement   TRIGGER FINGER RELEASE  08/22/2012   Procedure: RELEASE TRIGGER FINGER/A-1 PULLEY;  Surgeon: Nicki Reaper, MD;  Location: [facility];  Service: Orthopedics;  Laterality: Right;   VEIN LIGATION  1978   left   There are no problems to display for this patient.   PCP: Shelly Flatten  REFERRING PROVIDER: Shelly Flatten  REFERRING DIAG: Gait Instability, Neuropathy  THERAPY DIAG:  Other abnormalities of gait and mobility  Muscle weakness (generalized)  ONSET DATE:   SUBJECTIVE:                                                                                                                                                                                           SUBJECTIVE STATEMENT: 03/08/2022 Pt got injection in L knee last week, states moderate pain relief. Has been practicing balance exercises.   Eval: Pt states  worsening neuropathy , present for about 3-4 years. He had recent nerve conduction test from Neurologist. He works full time at [employer], lots of travel, walking in fields, outside at times.  Thinks balance is worsening has had 3-4 falls and several near falls. Going down stairs hard, has fallen when descending. He is doing less physical activity due to fear of falling. States leg weakness Also has low back pain, low level, constant, up to 4/10, more  in AM.  Also has L knee pain, having MRI in a couple weeks.     PERTINENT HISTORY:  Neuropathy, LBP, L knee pain,    PAIN:  Are you having pain? Yes: NPRS scale: 5-6/10 Pain location: L knee Pain description: Sharp Aggravating factors: stairs  Relieving factors: none stated  Are you having pain? Yes: NPRS scale: 4-5/10 Pain location: Low back Pain description: achey, constant Aggravating factors: 1st thing in am, getting up from sitting, bending Relieving factors: none stated  Are you having pain? Yes: NPRS scale: 2-3/10 Pain location: bil feet and lower legs  Pain description: Numbness Aggravating factors: increased standing activity  Relieving factors: none stated   PRECAUTIONS: Fall  WEIGHT BEARING RESTRICTIONS No  FALLS:  Has patient fallen in last 6 months? Yes. Number of falls 2.                   4 in last year  LIVING ENVIRONMENT: Lives with: lives with their family Lives in: House/apartment Stairs: Yes: External: 4 steps; rails: yes Has following equipment at home: None  OCCUPATION: Working full time at [employer], travels a lot, walking through airports, walking around [employer], little seated desk time.   PLOF: Independent  PATIENT GOALS  Improve balance, stability and strength in legs   OBJECTIVE:   DIAGNOSTIC FINDINGS:  Recent nerve conduction test Lumbar MRI:  Head MRI:    COGNITION:  Overall cognitive status: Within functional limits for tasks assessed    PALPATION: Significant stiffness  for Ankle DF bil and EV on R; stiffness in rearfoot and mid foot, as well as stiffness in toes. Tightness in bil HS,   LUMBAR ROM:   Active  AROM 02/18/2022  Flexion 14 in from floor   Extension Mild limitation/soreness  Right lateral flexion Min limitation  Left lateral flexion Min limitation  Right rotation   Left rotation    (Blank rows = not tested)  LE ROM: Hips: WFL  LE MMT: Bil Hips: 4/5, Knee ext: 4/5,  Flex: 4+/5,  Ankle: 4/5, Gr toe ext 4- bil;    LUMBAR SPECIAL TESTS:  Neg SLR   Balance 02/24/22  On foam narrow BOS EO 30" mild sway weight on toes/bouncing, EC on foam 23 seconds moderate sway weight on toes/bouncing  GAIT: Distance walked: 50 ft Assistive device utilized: None Level of assistance: Complete Independence Comments: L LE decreased coordination and variable step width/length.       TODAY'S TREATMENT   03/08/2022  Therapeutic Exercise: Aerobic: Supine:  Seated: sit to stand x 5, higher mat table x 10 for eccentric quad control;  Standing: mini Squats x 10; Step ups 4 in, 1 UE support, x 10 bil;  Neuromuscular Re-education:  Seated with bare feet on pad with vibration from theragun - 5 minutes - to improve proprioceptive input via vibration pathway.prior to balance exercises.  Standing March x 20;  Tandem stance 30 sec x 2 bil, with L/R head turns x 10 bil; Staggered stance weight shifts x 15 bil; A/P weight shifts x 20;  Air Ex: DLS with head turns x 15;    Previous interventions: Seated:  Sit to stand x 10;  Standing:  Squats x 10;  Heel raises x 10 ;  Stretches:  Gastroc stretch at wall 30 sec x 3 bil;    PATIENT EDUCATION:  Education details: on rationale for vibration, anatomy and current presentation.,  Person educated: Patient Education method: Explanation, Demonstration, Tactile cues, and Verbal cues Education comprehension:  verbalized understanding, returned demonstration, verbal cues required, tactile cues required, and needs  further education   HOME EXERCISE PROGRAM: Access Code: [email or web address] URL: [email or web address] Prepared by: Sedalia MutaLauren Deroy Noah    ASSESSMENT:  CLINICAL IMPRESSION:  03/08/2022 Vibration used at start of session for improved proprioception and control, pt states noted improvement in balance when using vibration. Discussed home use of vibration mat. Pt challenged with all activities today, due to instability. Quite challenged with head turns as well as posterior weight shifts, will benefit from progressive balance and proprioception exercises.    OBJECTIVE IMPAIRMENTS Abnormal gait, decreased activity tolerance, decreased balance, decreased coordination, decreased knowledge of use of DME, decreased mobility, difficulty walking, decreased strength, increased muscle spasms, impaired flexibility, impaired sensation, improper body mechanics, and pain.   ACTIVITY LIMITATIONS cleaning, community activity, driving, occupation, Pharmacologistlaundry, yard work, and shopping.   PERSONAL FACTORS 1 comorbidity: Neuropathy  are also affecting patient's functional outcome.    REHAB POTENTIAL: Good  CLINICAL DECISION MAKING: Stable/uncomplicated  EVALUATION COMPLEXITY: Low   GOALS: Goals reviewed with patient? Yes  SHORT TERM GOALS: Target date: 03/04/22  Patient to be independent with initial HEP  Goal status: INITIAL  2.  Patient to report decreased pain to 0-2/10   Goal status: INITIAL     LONG TERM GOALS: Target date: 04/15/22  Patient to be independent with final HEP  Goal status: INITIAL  2.  Patient to demo improved lumbar flexion ROM , to be at least 9 in from ground (improve by 5 inches)  and pain free, to improve ability for ADLs and IADLs.   Goal status: INITIAL  3.  Patient to demo improved strength of bil hips to at least 4+/5, and knees to 5/5 ,  to improve stability and stair ability.   Goal status: INITIAL  4.  Patient to improve on BERG by at least 5 points  Goal  status: INITIAL  5.  Pt to improve on DGI- TBD  Goal status: INITIAL  6. Pt to demo ability for independent/safe ability for stair navigation with 1 or less hand rail, reciprocal pattern, to improve ability for work and community activities.       PLAN: PT FREQUENCY: 2x/week  PT DURATION: 8 weeks  PLANNED INTERVENTIONS: Therapeutic exercises, Therapeutic activity, Neuromuscular re-education, Balance training, Gait training, Patient/Family education, Joint manipulation, Joint mobilization, Stair training, DME instructions, Dry Needling, Electrical stimulation, Spinal manipulation, Spinal mobilization, Cryotherapy, Moist heat, Taping, Traction, Ionotophoresis 4mg /ml Dexamethasone, and Manual therapy.  PLAN FOR NEXT SESSION:  Static balance, weight shifts, LE strength, Sit to stands, Heel raises.   Sedalia MutaLauren Harleigh Civello, PT, DPT 5:04 PM  03/08/22

## 2022-03-10 ENCOUNTER — Ambulatory Visit (INDEPENDENT_AMBULATORY_CARE_PROVIDER_SITE_OTHER): Payer: 59 | Admitting: Physical Therapy

## 2022-03-10 DIAGNOSIS — R2689 Other abnormalities of gait and mobility: Secondary | ICD-10-CM

## 2022-03-10 DIAGNOSIS — M6281 Muscle weakness (generalized): Secondary | ICD-10-CM | POA: Diagnosis not present

## 2022-03-10 NOTE — Therapy (Unsigned)
OUTPATIENT PHYSICAL THERAPY TREATMENT    Patient Name: [name] L Castelluccio MRN: [id number] DOB:[birth year], 66 y.o., male Today's Date: 03/10/2022      Past Medical History:  Diagnosis Date   Anxiety    Arthritis    Blurred vision    Depression    GERD (gastroesophageal reflux disease)    Hyperlipemia    Palpitations    PONV (postoperative nausea and vomiting)    Past Surgical History:  Procedure Laterality Date   CHOLECYSTECTOMY  11/18/2008   lap. chole.   COLONOSCOPY     SHOULDER ARTHROSCOPY  1998   rt   SHOULDER ARTHROSCOPY WITH SUBACROMIAL DECOMPRESSION  05/30/2005   right - with loose body exc., chondroplasty, partial labrum tear debridement   TRIGGER FINGER RELEASE  08/22/2012   Procedure: RELEASE TRIGGER FINGER/A-1 PULLEY;  Surgeon: Nicki ReaperGary R Kuzma, MD;  Location: [facility];  Service: Orthopedics;  Laterality: Right;   VEIN LIGATION  1978   left   There are no problems to display for this patient.   PCP: Shelly Flattenavid Merrell  REFERRING PROVIDER: Shelly Flattenavid Merrell  REFERRING DIAG: Gait Instability, Neuropathy  THERAPY DIAG:  No diagnosis found.  ONSET DATE:   SUBJECTIVE:                                                                                                                                                                                           SUBJECTIVE STATEMENT: 03/10/2022 Pt got injection in L knee last week, states moderate pain relief. Has been practicing balance exercises.   Eval: Pt states worsening neuropathy , present for about 3-4 years. He had recent nerve conduction test from Neurologist. He works full time at [employer], lots of travel, walking in fields, outside at times.  Thinks balance is worsening has had 3-4 falls and several near falls. Going down stairs hard, has fallen when descending. He is doing less physical activity due to fear of falling. States leg weakness Also has low back pain, low level, constant, up to 4/10, more in AM.   Also has L knee pain, having MRI in a couple weeks.     PERTINENT HISTORY:  Neuropathy, LBP, L knee pain,    PAIN:  Are you having pain? Yes: NPRS scale: 5-6/10 Pain location: L knee Pain description: Sharp Aggravating factors: stairs  Relieving factors: none stated  Are you having pain? Yes: NPRS scale: 4-5/10 Pain location: Low back Pain description: achey, constant Aggravating factors: 1st thing in am, getting up from sitting, bending Relieving factors: none stated  Are you having pain? Yes: NPRS scale: 2-3/10 Pain location: bil feet and lower legs  Pain description: Numbness Aggravating factors: increased standing activity  Relieving factors: none stated   PRECAUTIONS: Fall  WEIGHT BEARING RESTRICTIONS No  FALLS:  Has patient fallen in last 6 months? Yes. Number of falls 2.                   4 in last year  LIVING ENVIRONMENT: Lives with: lives with their family Lives in: House/apartment Stairs: Yes: External: 4 steps; rails: yes Has following equipment at home: None  OCCUPATION: Working full time at [employer], travels a lot, walking through airports, walking around [employer], little seated desk time.   PLOF: Independent  PATIENT GOALS  Improve balance, stability and strength in legs   OBJECTIVE:   DIAGNOSTIC FINDINGS:  Recent nerve conduction test Lumbar MRI:  Head MRI:    COGNITION:  Overall cognitive status: Within functional limits for tasks assessed    PALPATION: Significant stiffness for Ankle DF bil and EV on R; stiffness in rearfoot and mid foot, as well as stiffness in toes. Tightness in bil HS,   LUMBAR ROM:   Active  AROM 02/18/2022  Flexion 14 in from floor   Extension Mild limitation/soreness  Right lateral flexion Min limitation  Left lateral flexion Min limitation  Right rotation   Left rotation    (Blank rows = not tested)  LE ROM: Hips: WFL  LE MMT: Bil Hips: 4/5, Knee ext: 4/5,  Flex: 4+/5,  Ankle: 4/5, Gr toe  ext 4- bil;    LUMBAR SPECIAL TESTS:  Neg SLR   Balance 02/24/22  On foam narrow BOS EO 30" mild sway weight on toes/bouncing, EC on foam 23 seconds moderate sway weight on toes/bouncing  GAIT: Distance walked: 50 ft Assistive device utilized: None Level of assistance: Complete Independence Comments: L LE decreased coordination and variable step width/length.       TODAY'S TREATMENT   03/10/2022  Therapeutic Exercise: Aerobic: Supine:  Seated: sit to stand x 5, higher mat table x 10 for eccentric quad control;  Standing: mini Squats x 10; Step ups 4 in, 1 UE support, x 10 bil;  Neuromuscular Re-education:  Seated with bare feet on pad with vibration from theragun - 5 minutes - to improve proprioceptive input via vibration pathway.prior to balance exercises.  Standing March x 20;  Tandem stance 30 sec x 2 bil, with L/R head turns x 10 bil; Staggered stance weight shifts x 15 bil; A/P weight shifts x 20;  Air Ex: DLS with head turns x 15;    Previous interventions: Seated:  Sit to stand x 10;  Standing:  Squats x 10;  Heel raises x 10 ;  Stretches:  Gastroc stretch at wall 30 sec x 3 bil;    PATIENT EDUCATION:  Education details: on rationale for vibration, anatomy and current presentation.,  Person educated: Patient Education method: Explanation, Demonstration, Tactile cues, and Verbal cues Education comprehension: verbalized understanding, returned demonstration, verbal cues required, tactile cues required, and needs further education   HOME EXERCISE PROGRAM: Access Code: [email or web address] URL: [email or web address] Prepared by: Sedalia Muta    ASSESSMENT:  CLINICAL IMPRESSION:  03/10/2022 Vibration used at start of session for improved proprioception and control, pt states noted improvement in balance when using vibration. Discussed home use of vibration mat. Pt challenged with all activities today, due to instability. Quite challenged with head turns as  well as posterior weight shifts, will benefit from progressive balance and proprioception exercises.    OBJECTIVE IMPAIRMENTS Abnormal gait, decreased activity tolerance, decreased  balance, decreased coordination, decreased knowledge of use of DME, decreased mobility, difficulty walking, decreased strength, increased muscle spasms, impaired flexibility, impaired sensation, improper body mechanics, and pain.   ACTIVITY LIMITATIONS cleaning, community activity, driving, occupation, Pharmacologist, yard work, and shopping.   PERSONAL FACTORS 1 comorbidity: Neuropathy  are also affecting patient's functional outcome.    REHAB POTENTIAL: Good  CLINICAL DECISION MAKING: Stable/uncomplicated  EVALUATION COMPLEXITY: Low   GOALS: Goals reviewed with patient? Yes  SHORT TERM GOALS: Target date: 03/04/22  Patient to be independent with initial HEP  Goal status: INITIAL  2.  Patient to report decreased pain to 0-2/10   Goal status: INITIAL     LONG TERM GOALS: Target date: 04/15/22  Patient to be independent with final HEP  Goal status: INITIAL  2.  Patient to demo improved lumbar flexion ROM , to be at least 9 in from ground (improve by 5 inches)  and pain free, to improve ability for ADLs and IADLs.   Goal status: INITIAL  3.  Patient to demo improved strength of bil hips to at least 4+/5, and knees to 5/5 ,  to improve stability and stair ability.   Goal status: INITIAL  4.  Patient to improve on BERG by at least 5 points  Goal status: INITIAL  5.  Pt to improve on DGI- TBD  Goal status: INITIAL  6. Pt to demo ability for independent/safe ability for stair navigation with 1 or less hand rail, reciprocal pattern, to improve ability for work and community activities.       PLAN: PT FREQUENCY: 2x/week  PT DURATION: 8 weeks  PLANNED INTERVENTIONS: Therapeutic exercises, Therapeutic activity, Neuromuscular re-education, Balance training, Gait training, Patient/Family  education, Joint manipulation, Joint mobilization, Stair training, DME instructions, Dry Needling, Electrical stimulation, Spinal manipulation, Spinal mobilization, Cryotherapy, Moist heat, Taping, Traction, Ionotophoresis 4mg /ml Dexamethasone, and Manual therapy.  PLAN FOR NEXT SESSION:  Static balance, weight shifts, LE strength, Sit to stands, Heel raises.   , PT, DPT 1:20 PM  03/10/22

## 2022-03-12 ENCOUNTER — Encounter: Payer: Self-pay | Admitting: Physical Therapy

## 2022-03-14 ENCOUNTER — Ambulatory Visit (INDEPENDENT_AMBULATORY_CARE_PROVIDER_SITE_OTHER): Payer: 59 | Admitting: Physical Therapy

## 2022-03-14 ENCOUNTER — Encounter: Payer: Self-pay | Admitting: Physical Therapy

## 2022-03-14 DIAGNOSIS — R2689 Other abnormalities of gait and mobility: Secondary | ICD-10-CM

## 2022-03-14 DIAGNOSIS — M6281 Muscle weakness (generalized): Secondary | ICD-10-CM

## 2022-03-14 NOTE — Therapy (Signed)
OUTPATIENT PHYSICAL THERAPY TREATMENT    Patient Name: [name] MRN: [id number] DOB:10/18/1955, 66 y.o., male Today's Date: 03/14/2022    PT End of Session - 03/14/22 0805     Visit Number 6    Number of Visits 16    Date for PT Re-Evaluation 04/15/22    Authorization Type [employer]    PT Start Time 0805    PT Stop Time 0848    PT Time Calculation (min) 43 min    Equipment Utilized During Treatment Gait belt    Activity Tolerance Patient tolerated treatment well    Behavior During Therapy WFL for tasks assessed/performed               Past Medical History:  Diagnosis Date   Anxiety    Arthritis    Blurred vision    Depression    GERD (gastroesophageal reflux disease)    Hyperlipemia    Palpitations    PONV (postoperative nausea and vomiting)    Past Surgical History:  Procedure Laterality Date   CHOLECYSTECTOMY  11/18/2008   lap. chole.   COLONOSCOPY     SHOULDER ARTHROSCOPY  1998   rt   SHOULDER ARTHROSCOPY WITH SUBACROMIAL DECOMPRESSION  05/30/2005   right - with loose body exc., chondroplasty, partial labrum tear debridement   TRIGGER FINGER RELEASE  08/22/2012   Procedure: RELEASE TRIGGER FINGER/A-1 PULLEY;  Surgeon: Nicki Reaper, MD;  Location: [facility];  Service: Orthopedics;  Laterality: Right;   VEIN LIGATION  1978   left   There are no problems to display for this patient.   PCP: Shelly Flatten  REFERRING PROVIDER: Shelly Flatten  REFERRING DIAG: Gait Instability, Neuropathy  THERAPY DIAG:  Other abnormalities of gait and mobility  Muscle weakness (generalized)  ONSET DATE:   SUBJECTIVE:                                                                                                                                                                                           SUBJECTIVE STATEMENT: 03/14/2022 No new complaints.   Eval: Pt states worsening neuropathy , present for about 3-4 years. He had recent nerve conduction test  from Neurologist. He works full time at [employer], lots of travel, walking in fields, outside at times.  Thinks balance is worsening has had 3-4 falls and several near falls. Going down stairs hard, has fallen when descending. He is doing less physical activity due to fear of falling. States leg weakness Also has low back pain, low level, constant, up to 4/10, more in AM.  Also has L knee pain, having MRI in a  couple weeks.     PERTINENT HISTORY:  Neuropathy, LBP, L knee pain,    PAIN:  Are you having pain? Yes: NPRS scale: 5-6/10 Pain location: L knee Pain description: Sharp Aggravating factors: stairs  Relieving factors: none stated  Are you having pain? Yes: NPRS scale: 4-5/10 Pain location: Low back Pain description: achey, constant Aggravating factors: 1st thing in am, getting up from sitting, bending Relieving factors: none stated  Are you having pain? Yes: NPRS scale: 2-3/10 Pain location: bil feet and lower legs  Pain description: Numbness Aggravating factors: increased standing activity  Relieving factors: none stated   PRECAUTIONS: Fall  WEIGHT BEARING RESTRICTIONS No  FALLS:  Has patient fallen in last 6 months? Yes. Number of falls 2.                   4 in last year  LIVING ENVIRONMENT: Lives with: lives with their family Lives in: House/apartment Stairs: Yes: External: 4 steps; rails: yes Has following equipment at home: None  OCCUPATION: Working full time at [employer], travels a lot, walking through airports, walking around [employer], little seated desk time.   PLOF: Independent  PATIENT GOALS  Improve balance, stability and strength in legs   OBJECTIVE:   DIAGNOSTIC FINDINGS:  Recent nerve conduction test Lumbar MRI:  Head MRI:    COGNITION:  Overall cognitive status: Within functional limits for tasks assessed    PALPATION: Significant stiffness for Ankle DF bil and EV on R; stiffness in rearfoot and mid foot, as well as stiffness in  toes. Tightness in bil HS,   LUMBAR ROM:   Active  AROM 02/18/2022 AROM 03/14/22  Flexion 14 in from floor  8 in from floor   Extension Mild limitation/soreness   Right lateral flexion Min limitation   Left lateral flexion Min limitation   Right rotation    Left rotation     (Blank rows = not tested)  LE ROM: Hips: WFL  LE MMT: Bil Hips: 4/5, Knee ext: 4/5,  Flex: 4+/5,  Ankle: 4/5, Gr toe ext 4- bil;    LUMBAR SPECIAL TESTS:  Neg SLR   Balance 02/24/22  On foam narrow BOS EO 30" mild sway weight on toes/bouncing, EC on foam 23 seconds moderate sway weight on toes/bouncing  GAIT: Distance walked: 50 ft Assistive device utilized: None Level of assistance: Complete Independence Comments: L LE decreased coordination and variable step width/length.       TODAY'S TREATMENT   03/14/22:  Therapeutic Exercise: Aerobic: Supine:  Seated: LAQ 5 lb x 15 bil;  Standing: mini Squats x 15;  Step ups 6 in, 1 UE support, x 10 bil; Stairs: 1 UE support, recipricol , 5 steps x 4;  HR with 2 UE at counter, ball at ankles x 20;  Neuromuscular Re-education:  Standing March x 20;  Tandem stance 30 sec x 2 bil, with L/R head turns x 10 bil; Toe taps x 20 on 6 in step, no UE support;   Air Ex: fwd and lateral step ups x 15 ea bil;   03/10/2022 Therapeutic Exercise: Aerobic: Supine:  Seated: sit to stand x 5, higher mat table x 10 for eccentric quad control;  Standing: mini Squats x 10; Step ups 4 in, 1 UE support, x 10 bil;  Neuromuscular Re-education:  Seated with bare feet on pad with vibration from theragun - to improve proprioceptive input via vibration pathway.prior to balance exercises.  Standing March x 20;  Tandem stance 30 sec  x 2 bil, with L/R head turns x 10 bil; Staggered stance weight shifts x 15 bil; A/P weight shifts x 20;  Air Ex: DLS with head turns x 15; lateral step downs on AirEX x 15; fwd weight shift with reach, front leg on AirEx x 10 bil;     Previous  interventions: Seated:  Sit to stand x 10;  Standing:  Squats x 10;  Heel raises x 10 ;  Stretches:  Gastroc stretch at wall 30 sec x 3 bil;    PATIENT EDUCATION:  Education details: reviewed HEP Person educated: Patient Education method: Programmer, multimediaxplanation, Demonstration, Tactile cues, and Verbal cues Education comprehension: verbalized understanding, returned demonstration, verbal cues required, tactile cues required, and needs further education   HOME EXERCISE PROGRAM: Access Code: [email or web address] URL: [email or web address] Prepared by: Sedalia MutaLauren Revanth Neidig    ASSESSMENT:  CLINICAL IMPRESSION:  03/14/2022 Pt improving with ability for balance exercises and quad control. Still challenged with all activities, but improving since Eval. Noted improvement in sit to stand, squats, and step ups with improved quad strength and control. Pt with most sway and decreased stability at feet and toes, will benefit from continued, progressive balance, stability and proprioception as tolerated.    OBJECTIVE IMPAIRMENTS Abnormal gait, decreased activity tolerance, decreased balance, decreased coordination, decreased knowledge of use of DME, decreased mobility, difficulty walking, decreased strength, increased muscle spasms, impaired flexibility, impaired sensation, improper body mechanics, and pain.   ACTIVITY LIMITATIONS cleaning, community activity, driving, occupation, Pharmacologistlaundry, yard work, and shopping.   PERSONAL FACTORS 1 comorbidity: Neuropathy  are also affecting patient's functional outcome.    REHAB POTENTIAL: Good  CLINICAL DECISION MAKING: Stable/uncomplicated  EVALUATION COMPLEXITY: Low   GOALS: Goals reviewed with patient? Yes  SHORT TERM GOALS: Target date: 03/04/22  Patient to be independent with initial HEP  Goal status: INITIAL  2.  Patient to report decreased pain to 0-2/10   Goal status: INITIAL     LONG TERM GOALS: Target date: 04/15/22  Patient to be independent  with final HEP  Goal status: INITIAL  2.  Patient to demo improved lumbar flexion ROM , to be at least 9 in from ground (improve by 5 inches)  and pain free, to improve ability for ADLs and IADLs.   Goal status: INITIAL  3.  Patient to demo improved strength of bil hips to at least 4+/5, and knees to 5/5 ,  to improve stability and stair ability.   Goal status: INITIAL  4.  Patient to improve on BERG by at least 5 points  Goal status: INITIAL  5.  Pt to improve on DGI- TBD  Goal status: INITIAL  6. Pt to demo ability for independent/safe ability for stair navigation with 1 or less hand rail, reciprocal pattern, to improve ability for work and community activities.       PLAN: PT FREQUENCY: 2x/week  PT DURATION: 8 weeks  PLANNED INTERVENTIONS: Therapeutic exercises, Therapeutic activity, Neuromuscular re-education, Balance training, Gait training, Patient/Family education, Joint manipulation, Joint mobilization, Stair training, DME instructions, Dry Needling, Electrical stimulation, Spinal manipulation, Spinal mobilization, Cryotherapy, Moist heat, Taping, Traction, Ionotophoresis 4mg /ml Dexamethasone, and Manual therapy.  PLAN FOR NEXT SESSION:  Static balance, weight shifts, LE strength, Sit to stands, Heel raises.   Sedalia MutaLauren Trevyon Swor, PT, DPT 10:44 AM  03/14/22

## 2022-03-16 ENCOUNTER — Ambulatory Visit (INDEPENDENT_AMBULATORY_CARE_PROVIDER_SITE_OTHER): Payer: 59 | Admitting: Physical Therapy

## 2022-03-16 ENCOUNTER — Encounter: Payer: Self-pay | Admitting: Physical Therapy

## 2022-03-16 DIAGNOSIS — M6281 Muscle weakness (generalized): Secondary | ICD-10-CM

## 2022-03-16 DIAGNOSIS — R2689 Other abnormalities of gait and mobility: Secondary | ICD-10-CM | POA: Diagnosis not present

## 2022-03-16 NOTE — Therapy (Signed)
OUTPATIENT PHYSICAL THERAPY TREATMENT    Patient Name: [name] MRN: [id number] DOB:10-25-1955, 66 y.o., male Today's Date: 03/16/2022    PT End of Session - 03/16/22 0807     Visit Number 7    Number of Visits 16    Date for PT Re-Evaluation 04/15/22    Authorization Type [employer]    PT Start Time 0805    PT Stop Time 0849    PT Time Calculation (min) 44 min    Equipment Utilized During Treatment Gait belt    Activity Tolerance Patient tolerated treatment well    Behavior During Therapy WFL for tasks assessed/performed               Past Medical History:  Diagnosis Date   Anxiety    Arthritis    Blurred vision    Depression    GERD (gastroesophageal reflux disease)    Hyperlipemia    Palpitations    PONV (postoperative nausea and vomiting)    Past Surgical History:  Procedure Laterality Date   CHOLECYSTECTOMY  11/18/2008   lap. chole.   COLONOSCOPY     SHOULDER ARTHROSCOPY  1998   rt   SHOULDER ARTHROSCOPY WITH SUBACROMIAL DECOMPRESSION  05/30/2005   right - with loose body exc., chondroplasty, partial labrum tear debridement   TRIGGER FINGER RELEASE  08/22/2012   Procedure: RELEASE TRIGGER FINGER/A-1 PULLEY;  Surgeon: Nicki Reaper, MD;  [facility]  [facility];  Service: Orthopedics;  Laterality: Right;   VEIN LIGATION  1978   left   There are no problems to display for this patient.   PCP: Shelly Flatten  REFERRING PROVIDER: Shelly Flatten  REFERRING DIAG: Gait Instability, Neuropathy  THERAPY DIAG:  Other abnormalities of gait and mobility  Muscle weakness (generalized)  ONSET DATE:   SUBJECTIVE:                                                                                                                                                                                           SUBJECTIVE STATEMENT: 03/16/2022 No new complaints.   Eval: Pt states worsening neuropathy , present for about 3-4 years. He had recent nerve conduction test  from Neurologist. He works full time at [employer], lots of travel, walking in fields, outside at times.  Thinks balance is worsening has had 3-4 falls and several near falls. Going down stairs hard, has fallen when descending. He is doing less physical activity due to fear of falling. States leg weakness Also has low back pain, low level, constant, up to 4/10, more in AM.  Also has L knee pain, having MRI in a  couple weeks.     PERTINENT HISTORY:  Neuropathy, LBP, L knee pain,    PAIN:  Are you having pain? Yes: NPRS scale: 5-6/10 Pain location: L knee Pain description: Sharp Aggravating factors: stairs  Relieving factors: none stated  Are you having pain? Yes: NPRS scale: 4-5/10 Pain location: Low back Pain description: achey, constant Aggravating factors: 1st thing in am, getting up from sitting, bending Relieving factors: none stated  Are you having pain? Yes: NPRS scale: 2-3/10 Pain location: bil feet and lower legs  Pain description: Numbness Aggravating factors: increased standing activity  Relieving factors: none stated   PRECAUTIONS: Fall  WEIGHT BEARING RESTRICTIONS No  FALLS:  Has patient fallen in last 6 months? Yes. Number of falls 2.                   4 in last year  LIVING ENVIRONMENT: Lives with: lives with their family Lives in: House/apartment Stairs: Yes: External: 4 steps; rails: yes Has following equipment at home: None  OCCUPATION: Working full time at [employer], travels a lot, walking through airports, walking around [employer], little seated desk time.   PLOF: Independent  PATIENT GOALS  Improve balance, stability and strength in legs   OBJECTIVE:   DIAGNOSTIC FINDINGS:  Recent nerve conduction test Lumbar MRI:  Head MRI:    COGNITION:  Overall cognitive status: Within functional limits for tasks assessed    PALPATION: Significant stiffness for Ankle DF bil and EV on R; stiffness in rearfoot and mid foot, as well as stiffness in  toes. Tightness in bil HS,   LUMBAR ROM:   Active  AROM 02/18/2022 AROM 03/14/22  Flexion 14 in from floor  8 in from floor   Extension Mild limitation/soreness   Right lateral flexion Min limitation   Left lateral flexion Min limitation   Right rotation    Left rotation     (Blank rows = not tested)  LE ROM: Hips: WFL  LE MMT: Bil Hips: 4/5, Knee ext: 4/5,  Flex: 4+/5,  Ankle: 4/5, Gr toe ext 4- bil;    LUMBAR SPECIAL TESTS:  Neg SLR   Balance 02/24/22  On foam narrow BOS EO 30" mild sway weight on toes/bouncing, EC on foam 23 seconds moderate sway weight on toes/bouncing  GAIT: Distance walked: 50 ft Assistive device utilized: None Level of assistance: Complete Independence Comments: L LE decreased coordination and variable step width/length.       TODAY'S TREATMENT   03/16/22:  Therapeutic Exercise: Aerobic: Supine: Bridging x 20;  Seated:  Standing: Step ups 6 in, 1 UE support, x 10 bil; Stairs: 1 UE support, recipricol , 5 steps x 4;  HR with 2 UE at counter, ball at ankles x 20;  Sit to stand 10 lb x 15; Gastroc stretch 30 sec x 2 bil;  Neuromuscular Re-education:  Standing March x 20; AirEx March x 20;  A/P weight shifts AirEx;  Bwd walking 20 ft x 4;      03/10/2022 Therapeutic Exercise: Aerobic: Supine:  Seated: sit to stand x 5, higher mat table x 10 for eccentric quad control;  Standing: mini Squats x 10; Step ups 4 in, 1 UE support, x 10 bil;  Neuromuscular Re-education:  Seated with bare feet on pad with vibration from theragun - to improve proprioceptive input via vibration pathway.prior to balance exercises.  Standing March x 20;  Tandem stance 30 sec x 2 bil, with L/R head turns x 10 bil; Staggered stance weight  shifts x 15 bil; A/P weight shifts x 20;  Air Ex: DLS with head turns x 15; lateral step downs on AirEX x 15; fwd weight shift with reach, front leg on AirEx x 10 bil;     Previous interventions: Seated:  Sit to stand x 10;   Standing:  Squats x 10;  Heel raises x 10 ;  Stretches:  Gastroc stretch at wall 30 sec x 3 bil;    PATIENT EDUCATION:  Education details: reviewed HEP Person educated: Patient Education method: Consulting civil engineer, Demonstration, Tactile cues, and Verbal cues Education comprehension: verbalized understanding, returned demonstration, verbal cues required, tactile cues required, and needs further education   HOME EXERCISE PROGRAM: Access Code: [email or web address] URL: [email or web address] Prepared by: Lyndee Hensen    ASSESSMENT:  CLINICAL IMPRESSION:  03/16/2022 Pt continues to be challenged with all balance activities. Very difficulty walking backwards today.  Pt going out of town for work for a few weeks. Will schedule when he returns.  Pt will benefit from continued, progressive balance, stability and proprioception as tolerated.    OBJECTIVE IMPAIRMENTS Abnormal gait, decreased activity tolerance, decreased balance, decreased coordination, decreased knowledge of use of DME, decreased mobility, difficulty walking, decreased strength, increased muscle spasms, impaired flexibility, impaired sensation, improper body mechanics, and pain.   ACTIVITY LIMITATIONS cleaning, community activity, driving, occupation, Medical sales representative, yard work, and shopping.   PERSONAL FACTORS 1 comorbidity: Neuropathy  are also affecting patient's functional outcome.    REHAB POTENTIAL: Good  CLINICAL DECISION MAKING: Stable/uncomplicated  EVALUATION COMPLEXITY: Low   GOALS: Goals reviewed with patient? Yes  SHORT TERM GOALS: Target date: 03/04/22  Patient to be independent with initial HEP  Goal status: INITIAL  2.  Patient to report decreased pain to 0-2/10   Goal status: INITIAL     LONG TERM GOALS: Target date: 04/15/22  Patient to be independent with final HEP  Goal status: INITIAL  2.  Patient to demo improved lumbar flexion ROM , to be at least 9 in from ground (improve by 5 inches)   and pain free, to improve ability for ADLs and IADLs.   Goal status: INITIAL  3.  Patient to demo improved strength of bil hips to at least 4+/5, and knees to 5/5 ,  to improve stability and stair ability.   Goal status: INITIAL  4.  Patient to improve on BERG by at least 5 points  Goal status: INITIAL  5.  Pt to improve on DGI- TBD  Goal status: INITIAL  6. Pt to demo ability for independent/safe ability for stair navigation with 1 or less hand rail, reciprocal pattern, to improve ability for work and community activities.       PLAN: PT FREQUENCY: 2x/week  PT DURATION: 8 weeks  PLANNED INTERVENTIONS: Therapeutic exercises, Therapeutic activity, Neuromuscular re-education, Balance training, Gait training, Patient/Family education, Joint manipulation, Joint mobilization, Stair training, DME instructions, Dry Needling, Electrical stimulation, Spinal manipulation, Spinal mobilization, Cryotherapy, Moist heat, Taping, Traction, Ionotophoresis 4mg /ml Dexamethasone, and Manual therapy.  PLAN FOR NEXT SESSION:  Static balance, weight shifts, LE strength, Sit to stands, Heel raises.   Lyndee Hensen, PT, DPT 6:18 PM  03/16/22

## 2022-05-22 IMAGING — DX DG KNEE 1-2V*L*
2 series · 2 of 2 positions shown · non-contrast
Comparison: None.

CLINICAL DATA: Knee pain

EXAM:
LEFT KNEE - 1-2 VIEW

[knee ap]
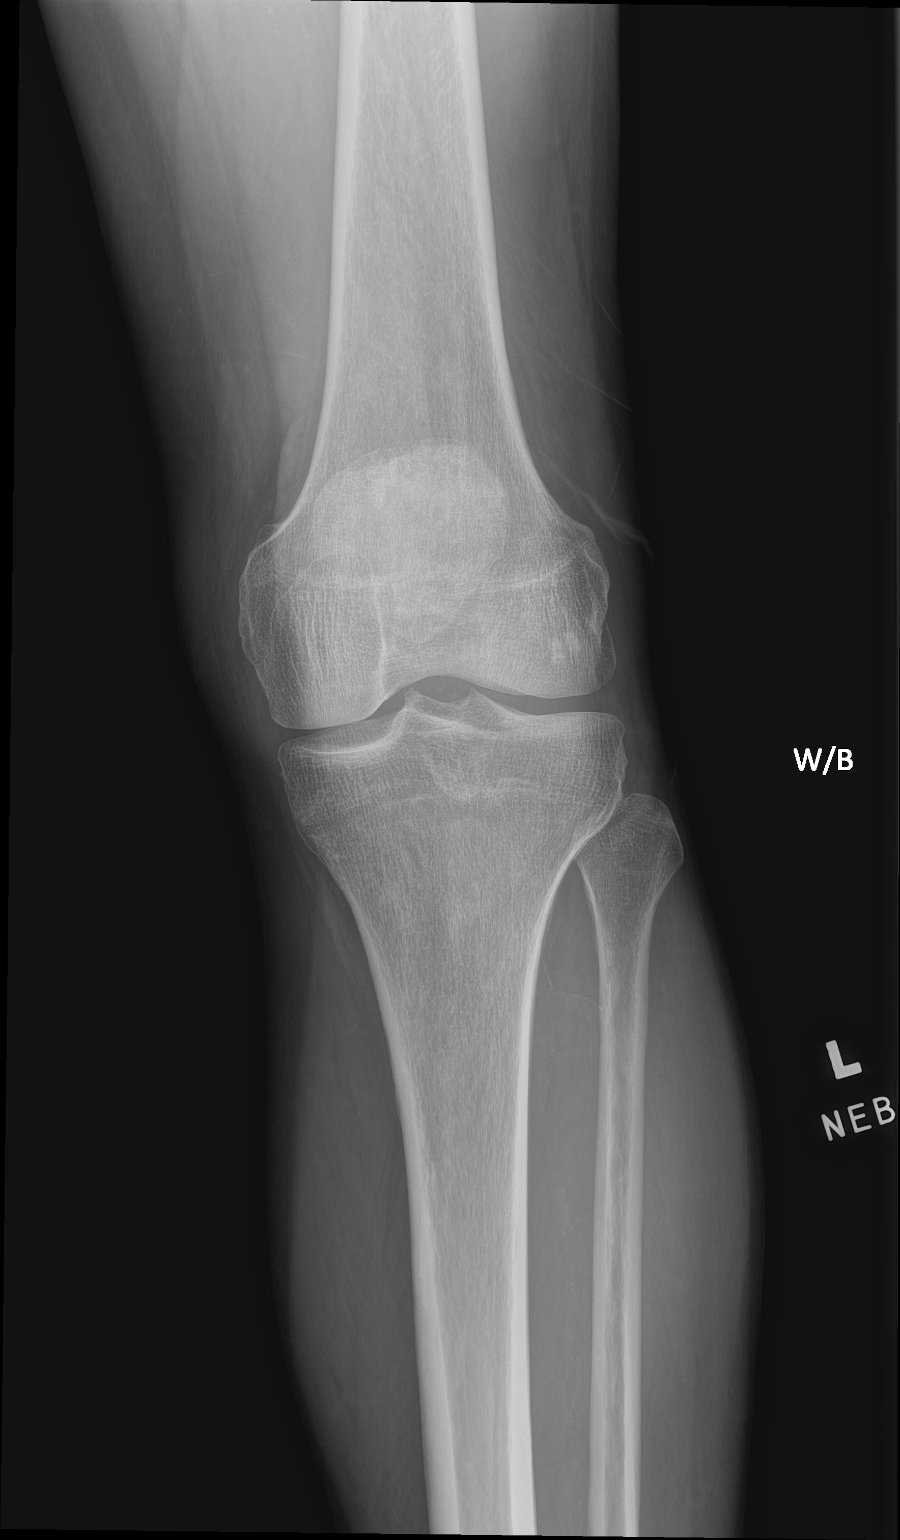

[knee lat]
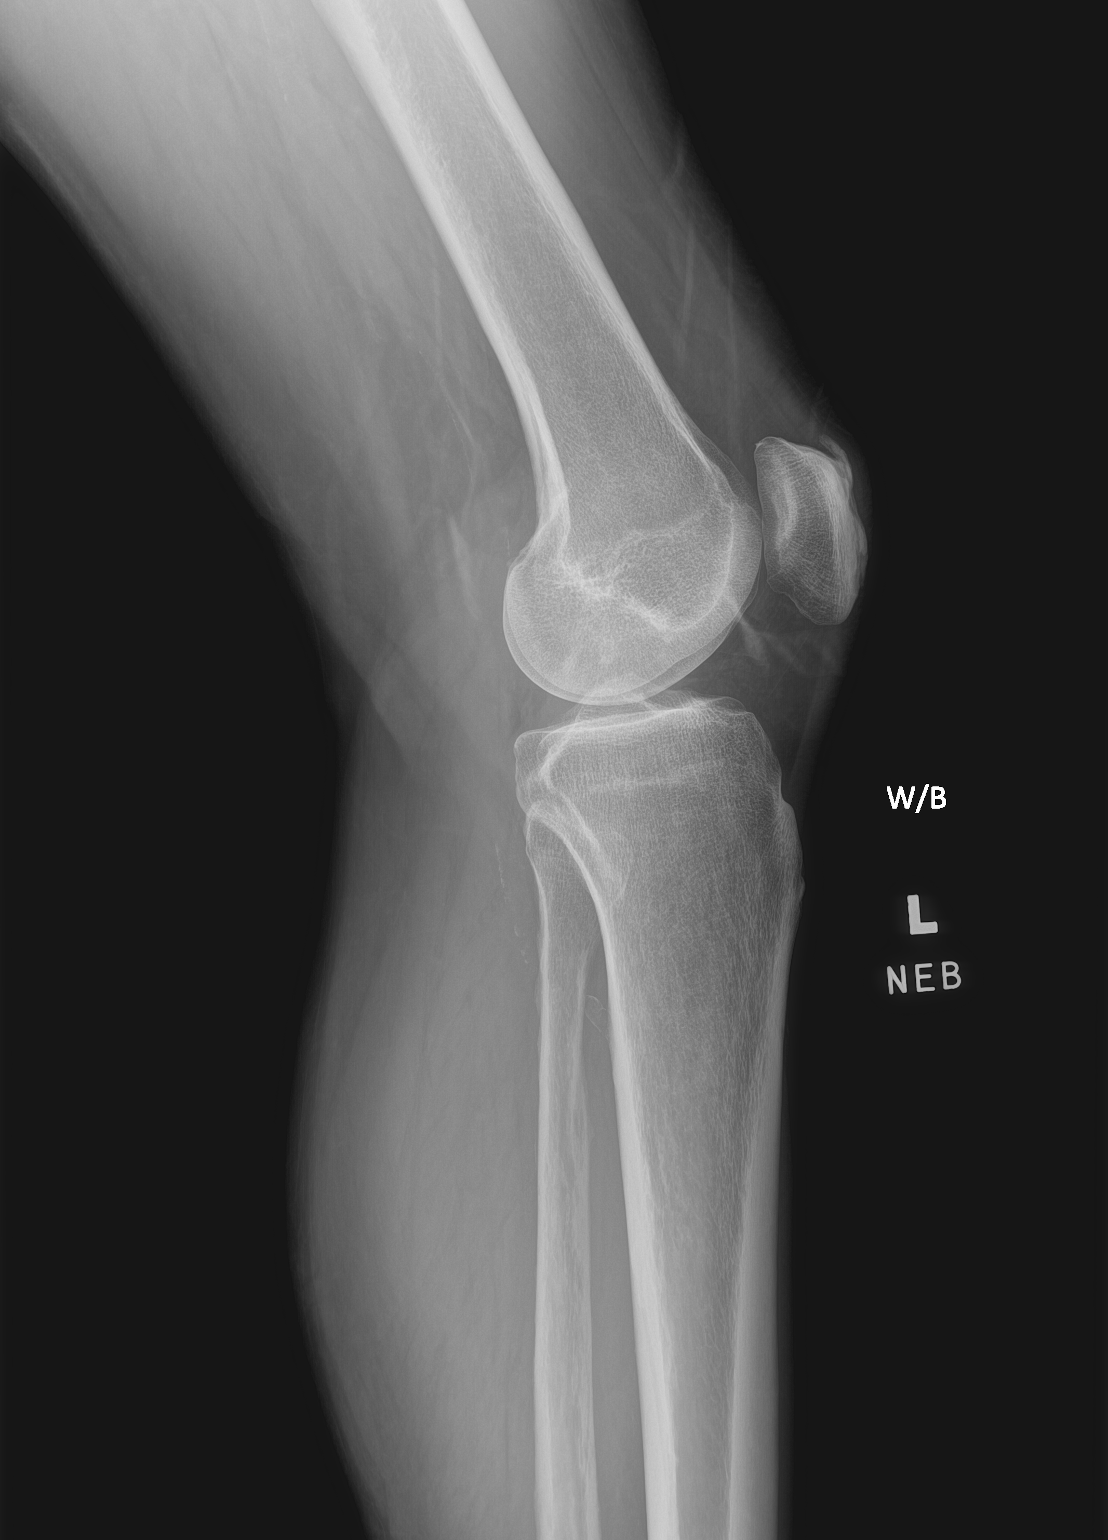

[2 of 2 positions shown; findings below may reference images not displayed]

FINDINGS: No evidence of fracture, dislocation, or joint effusion. No evidence
of arthropathy or other focal bone abnormality. Soft tissues are
unremarkable.
IMPRESSION: Negative.

## 2022-05-22 IMAGING — DX DG HIP (WITH OR WITHOUT PELVIS) 2V BILAT
3 series · 3 of 3 positions shown · non-contrast
Comparison: None.

CLINICAL DATA: Hip pain

EXAM:
DG HIP (WITH OR WITHOUT PELVIS) 2V BILAT

[pelvis ap]
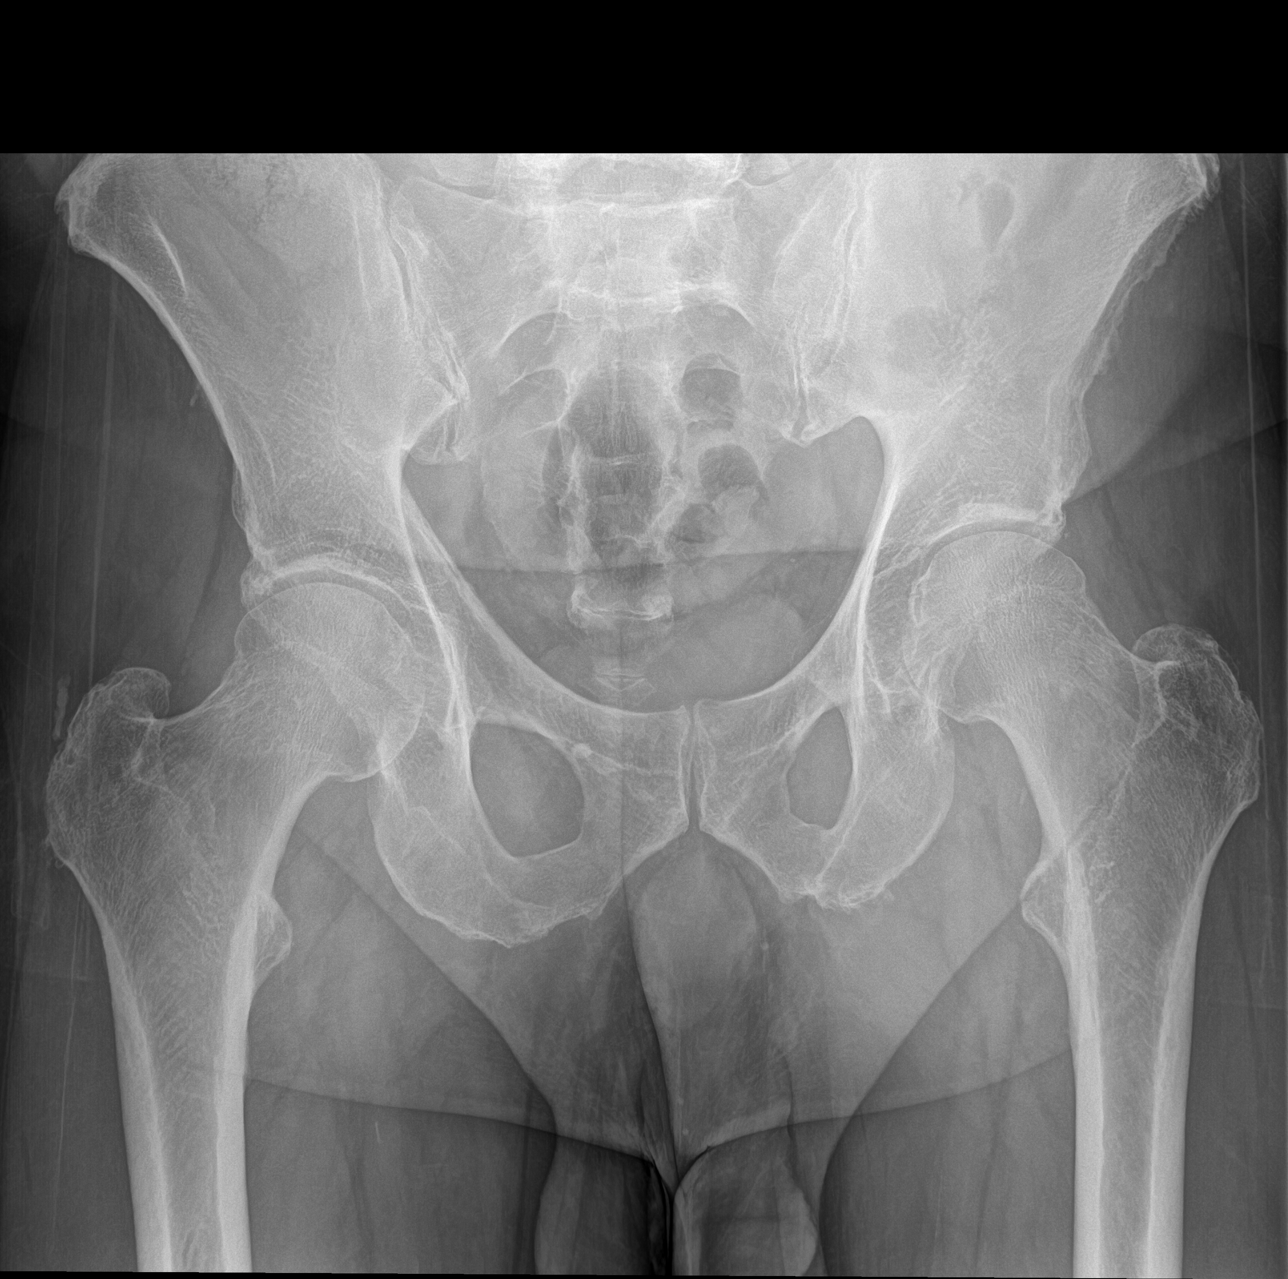

[hip lat (1 of 2)]
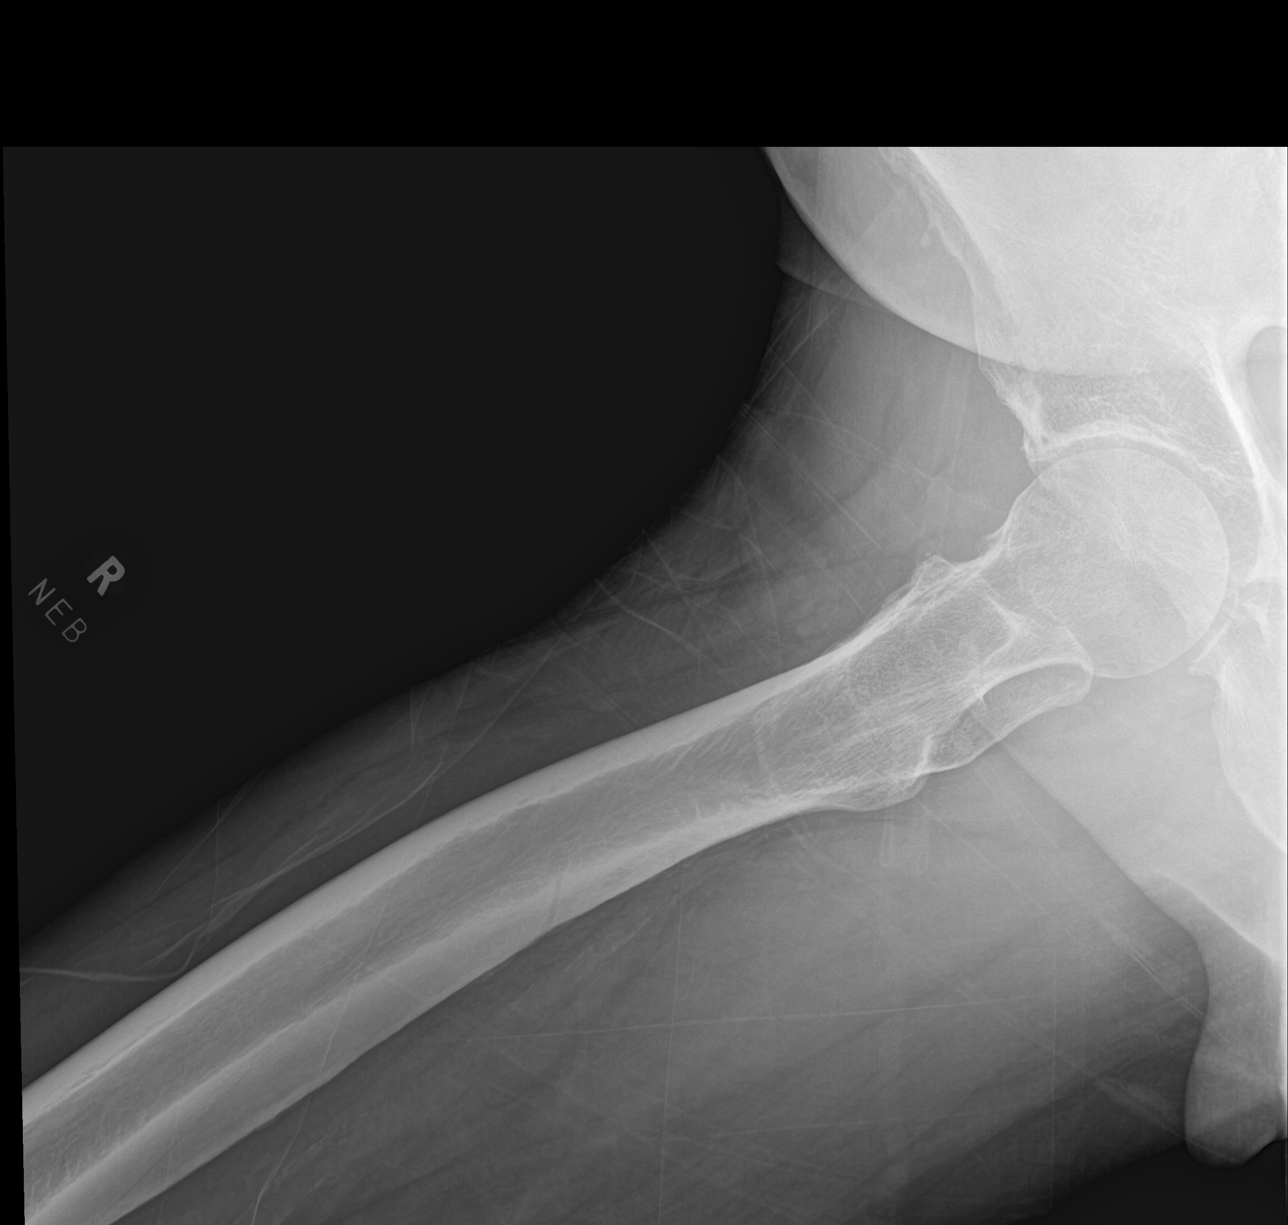

[hip lat (2 of 2)]
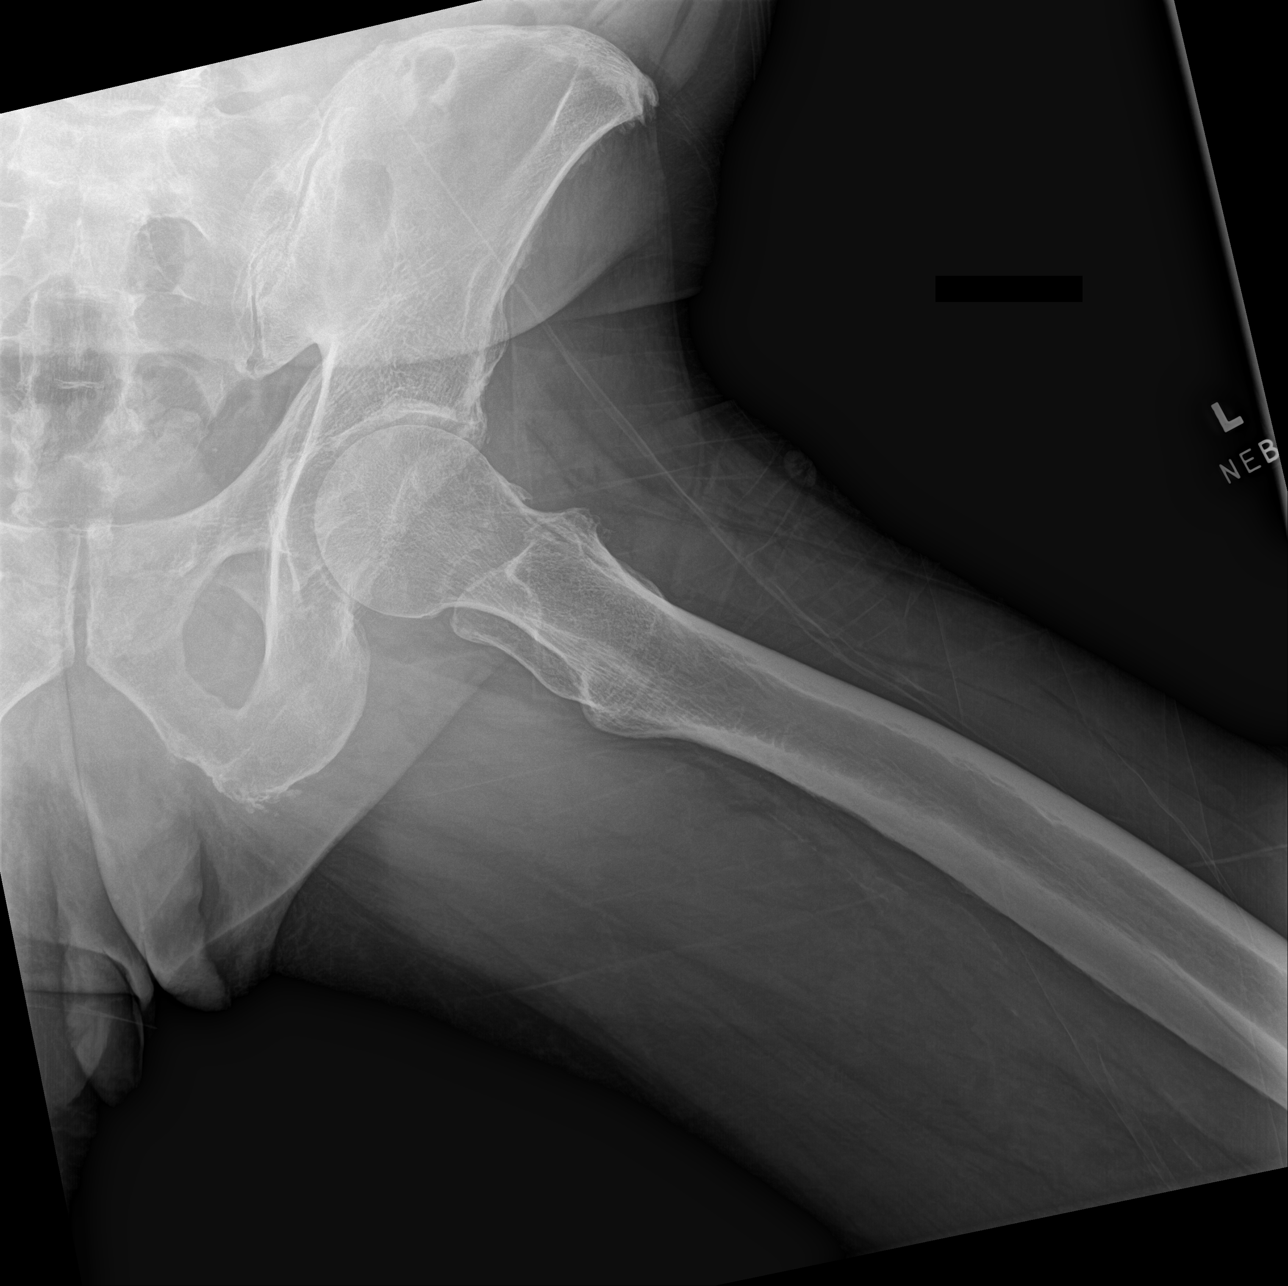

[3 of 3 positions shown; findings below may reference images not displayed]

FINDINGS: SI joints are patent. Pubic symphysis and rami are intact. No
fracture or malalignment. Mild degenerative changes of both hips.
IMPRESSION: Mild degenerative changes of both hips

## 2022-06-27 ENCOUNTER — Ambulatory Visit (INDEPENDENT_AMBULATORY_CARE_PROVIDER_SITE_OTHER): Payer: 59 | Admitting: Physician Assistant

## 2022-06-27 ENCOUNTER — Encounter: Payer: Self-pay | Admitting: Physician Assistant

## 2022-06-27 ENCOUNTER — Telehealth: Payer: Self-pay

## 2022-06-27 VITALS — Ht 77.0 in | Wt 210.6 lb

## 2022-06-27 DIAGNOSIS — M1712 Unilateral primary osteoarthritis, left knee: Secondary | ICD-10-CM

## 2022-06-27 MED ORDER — LIDOCAINE HCL 1 % IJ SOLN
3.0000 mL | INTRAMUSCULAR | Status: AC | PRN
  Administered 2022-06-27: 3 mL

## 2022-06-27 MED ORDER — METHYLPREDNISOLONE ACETATE 40 MG/ML IJ SUSP
40.0000 mg | INTRAMUSCULAR | Status: AC | PRN
  Administered 2022-06-27: 40 mg via INTRA_ARTICULAR

## 2022-06-27 NOTE — Progress Notes (Signed)
Office Visit Note   Patient: [name]           Date of Birth: 11-30-1955           MRN: [id number] Visit Date: 06/27/2022              Requested by: Waldemar Dickens, MD [address],  [place] [address] PCP: Waldemar Dickens, MD   Assessment & Plan: Visit Diagnoses:  1. Unilateral primary osteoarthritis, left knee     Plan: Recommend a add diclofenac topical 4 g 4 times daily to the left knee.  We will work on obtaining approval for supplemental injection of his left knee have him back once this is available.  Discussed with him quad strengthening exercises particularly VMO exercises and discussed knee friendly exercises with him at length.  Questions were encouraged and answered at length.  Follow-Up Instructions: Return for Supplemental injection.   Orders:  No orders of the defined types were placed in this encounter.  No orders of the defined types were placed in this encounter.     Procedures: No procedures performed   Clinical Data: No additional findings.   Subjective: Chief Complaint  Patient presents with   Left Knee - Pain    HPI [name] is a pleasant 66 year old male were seen for left knee pain this been ongoing for the past year.  He states that his knee goes out and occasionally whenever he is using his the stairs.  He has pain under his kneecap.  Standing makes his knee pain worse.  He has tried cortisone injections in the knee couple of months ago only helped with the pain for temporary amount of time.  He has no history of knee injury.  He did undergo an MRI of the knee.  He has taken oral diclofenac in the past but is currently not taking this.  Did find some benefit with the oral diclofenac.  He is also tried turmeric without any real relief. He underwent an MRI of his left knee on 02/28/2022.  MRI images are reviewed with the patient today.  This images show an area of full-thickness cartilage defect involving the trochlear and  fissuring of the medial tibiofemoral compartment consistent with moderate medial compartmental arthritis.  No meniscal changes.  Lateral compartment well-preserved.  No acute fractures. Plain radiographs of the left knee here reviewed from 03/09/2021 and showed no acute fracture knee is well located.  No significant arthritic changes. Review of Systems See HPI  Objective: Vital Signs: Ht 6\' 5"  (1.956 m)   Wt 210 lb 9.6 oz (95.5 kg)   BMI 24.97 kg/m   Physical Exam Constitutional:      Appearance: He is normal weight. He is not ill-appearing or diaphoretic.  Pulmonary:     Effort: Pulmonary effort is normal.  Neurological:     Mental Status: He is alert and oriented to person, place, and time.  Psychiatric:        Mood and Affect: Mood normal.     Ortho Exam Bilateral knees no abnormal warmth erythema or effusion.  Left knee health femoral crepitus.  No instability valgus varus stressing left knee. Specialty Comments:  No specialty comments available.  Imaging: No results found.   PMFS History: There are no problems to display for this patient.  Past Medical History:  Diagnosis Date   Anxiety    Arthritis    Blurred vision    Depression  GERD (gastroesophageal reflux disease)    Hyperlipemia    Palpitations    PONV (postoperative nausea and vomiting)     Family History  Problem Relation Age of Onset   Coronary artery disease Father    Bladder Cancer Father    Neuropathy Father    Cholecystitis Brother    Arthritis Brother    Supraventricular tachycardia Son    Ehlers-Danlos syndrome Son     Past Surgical History:  Procedure Laterality Date   CHOLECYSTECTOMY  11/18/2008   lap. chole.   COLONOSCOPY     SHOULDER ARTHROSCOPY  1998   rt   SHOULDER ARTHROSCOPY WITH SUBACROMIAL DECOMPRESSION  05/30/2005   right - with loose body exc., chondroplasty, partial labrum tear debridement   TRIGGER FINGER RELEASE  08/22/2012   Procedure: RELEASE TRIGGER FINGER/A-1  PULLEY;  Surgeon: Nicki Reaper, MD;  Location: [facility];  Service: Orthopedics;  Laterality: Right;   VEIN LIGATION  1978   left   Social History   Occupational History   Not on file  Tobacco Use   Smoking status: Never   Smokeless tobacco: Never  Vaping Use   Vaping Use: Never used  Substance and Sexual Activity   Alcohol use: Yes    Comment: daily wine   Drug use: No   Sexual activity: Not on file

## 2022-06-27 NOTE — Telephone Encounter (Signed)
Please get auth for left knee gel injection-gil pt  PER GIL PLEASE DO [id number]

## 2022-06-27 NOTE — Progress Notes (Signed)
   Procedure Note  Patient: [name]             Date of Birth: 1956-10-02           MRN: [id number]             Visit Date: 06/27/2022  Procedures: Visit Diagnoses:  1. Unilateral primary osteoarthritis, left knee     Large Joint Inj: L knee on 06/27/2022 6:13 PM Indications: pain Details: 22 G 1.5 in needle, anterolateral approach  Arthrogram: No  Medications: 3 mL lidocaine 1 %; 40 mg methylPREDNISolone acetate 40 MG/ML Outcome: tolerated well, no immediate complications Procedure, treatment alternatives, risks and benefits explained, specific risks discussed. Consent was given by the patient. Immediately prior to procedure a time out was called to verify the correct patient, procedure, equipment, support staff and site/side marked as required. Patient was prepped and draped in the usual sterile fashion.

## 2022-06-28 NOTE — Telephone Encounter (Signed)
VOB submitted for Euflexxa, left knee  

## 2022-08-10 ENCOUNTER — Other Ambulatory Visit: Payer: Self-pay

## 2022-08-10 DIAGNOSIS — M1712 Unilateral primary osteoarthritis, left knee: Secondary | ICD-10-CM

## 2022-08-11 ENCOUNTER — Ambulatory Visit (INDEPENDENT_AMBULATORY_CARE_PROVIDER_SITE_OTHER): Payer: 59 | Admitting: Physician Assistant

## 2022-08-11 ENCOUNTER — Encounter: Payer: Self-pay | Admitting: Physician Assistant

## 2022-08-11 DIAGNOSIS — M1712 Unilateral primary osteoarthritis, left knee: Secondary | ICD-10-CM | POA: Diagnosis not present

## 2022-08-11 MED ORDER — SODIUM HYALURONATE (VISCOSUP) 20 MG/2ML IX SOSY
20.0000 mg | PREFILLED_SYRINGE | INTRA_ARTICULAR | Status: AC | PRN
  Administered 2022-08-11: 20 mg via INTRA_ARTICULAR

## 2022-08-11 NOTE — Progress Notes (Signed)
   Procedure Note  Patient: [name]             Date of Birth: 1956-01-13           MRN: [id number]             Visit Date: 08/11/2022  HPI [name] returns today for scheduled Euflexxa injection left knee.  He states he has had no change in the knee pain.  Patient had no new injuries.  Given MRI shows full-thickness cartilage loss involving the trochlear groove and fissuring of the medial tibial femoral compartment consistent with moderate medial compartmental arthritis.  He has tried cortisone injections without any real relief.  He has no scheduled knee surgery in the next 6 months.  Review of systems: See HPI otherwise negative  Physical exam: General well-developed well-nourished male who ambulates without any assistive device. Left knee: Good range of motion left knee no abnormal warmth erythema or effusion Procedures: Visit Diagnoses:  1. Unilateral primary osteoarthritis, left knee     Large Joint Inj on 08/11/2022 1:09 PM Indications: pain Details: 22 G 1.5 in needle, anterolateral approach  Arthrogram: No  Medications: 20 mg Sodium Hyaluronate (Viscosup) 20 MG/2ML Outcome: tolerated well, no immediate complications Procedure, treatment alternatives, risks and benefits explained, specific risks discussed. Consent was given by the patient. Immediately prior to procedure a time out was called to verify the correct patient, procedure, equipment, support staff and site/side marked as required. Patient was prepped and draped in the usual sterile fashion.     Plan: We will see back in 1 week for his second of 3 Euflexxa injections.  Patient tolerated injection well today.

## 2022-08-18 ENCOUNTER — Encounter: Payer: Self-pay | Admitting: Physician Assistant

## 2022-08-18 ENCOUNTER — Ambulatory Visit (INDEPENDENT_AMBULATORY_CARE_PROVIDER_SITE_OTHER): Payer: 59 | Admitting: Physician Assistant

## 2022-08-18 DIAGNOSIS — M1712 Unilateral primary osteoarthritis, left knee: Secondary | ICD-10-CM | POA: Diagnosis not present

## 2022-08-18 MED ORDER — SODIUM HYALURONATE (VISCOSUP) 20 MG/2ML IX SOSY
20.0000 mg | PREFILLED_SYRINGE | INTRA_ARTICULAR | Status: AC | PRN
  Administered 2022-08-18: 20 mg via INTRA_ARTICULAR

## 2022-08-18 NOTE — Progress Notes (Signed)
   Procedure Note  Patient: [name]             Date of Birth: 1955-12-02           MRN: [id number]             Visit Date: 08/18/2022  HPI: Mr. Gariepy returns today for his second of 3 Euflexxa injections.  He states he had no adverse effects to the first injection.  He has been at the meeting lately and has been doing a lot of standing and he feels that he may have overdone it and has had some increased pain over the last few days in the knee.  Otherwise no injury.  Physical exam: General well-developed well-nourished male walks without an assistive device with a slight antalgic gait on the left. Left knee: No abnormal or erythema      Procedures: Visit Diagnoses:  1. Unilateral primary osteoarthritis, left knee     Large Joint Inj on 08/18/2022 1:13 PM Indications: pain Details: 22 G 1.5 in needle, anterolateral approach  Arthrogram: No  Medications: 20 mg Sodium Hyaluronate (Viscosup) 20 MG/2ML Outcome: tolerated well, no immediate complications Procedure, treatment alternatives, risks and benefits explained, specific risks discussed. Consent was given by the patient. Immediately prior to procedure a time out was called to verify the correct patient, procedure, equipment, support staff and site/side marked as required. Patient was prepped and draped in the usual sterile fashion.      Plan: We will see him back in 1 week for his third Euflexxa injection.  Patient tolerated injection well today.

## 2022-08-25 ENCOUNTER — Encounter: Payer: Self-pay | Admitting: Physician Assistant

## 2022-08-25 ENCOUNTER — Ambulatory Visit (INDEPENDENT_AMBULATORY_CARE_PROVIDER_SITE_OTHER): Payer: 59 | Admitting: Physician Assistant

## 2022-08-25 DIAGNOSIS — Z96652 Presence of left artificial knee joint: Secondary | ICD-10-CM | POA: Diagnosis not present

## 2022-08-25 DIAGNOSIS — M1712 Unilateral primary osteoarthritis, left knee: Secondary | ICD-10-CM | POA: Diagnosis not present

## 2022-08-25 MED ORDER — SODIUM HYALURONATE (VISCOSUP) 20 MG/2ML IX SOSY
20.0000 mg | PREFILLED_SYRINGE | INTRA_ARTICULAR | Status: AC | PRN
  Administered 2022-08-25: 20 mg via INTRA_ARTICULAR

## 2022-08-25 NOTE — Progress Notes (Signed)
   Procedure Note  Patient: [name]             Date of Birth: May 24, 1956           MRN: [id number]             Visit Date: 08/25/2022 HPI: Patient returns today for his third Euflexxa injection.  He has had no adverse effects.  He states overall knee is doing well. Procedures: Visit Diagnoses:  1. Status post total left knee replacement     Large Joint Inj: L knee on 08/25/2022 1:11 PM Indications: pain Details: 22 G 1.5 in needle, anterolateral approach  Arthrogram: No  Medications: 20 mg Sodium Hyaluronate (Viscosup) 20 MG/2ML Outcome: tolerated well, no immediate complications Procedure, treatment alternatives, risks and benefits explained, specific risks discussed. Consent was given by the patient. Immediately prior to procedure a time out was called to verify the correct patient, procedure, equipment, support staff and site/side marked as required. Patient was prepped and draped in the usual sterile fashion.    Plan: He will follow-up with [place] as needed.  He knows to wait at least 6 months between supplemental injections.

## 2022-08-30 ENCOUNTER — Other Ambulatory Visit: Payer: Self-pay | Admitting: Orthopedic Surgery

## 2022-12-15 ENCOUNTER — Encounter: Payer: Self-pay | Admitting: Radiology

## 2023-05-18 ENCOUNTER — Emergency Department (HOSPITAL_COMMUNITY): Payer: 59

## 2023-05-18 ENCOUNTER — Other Ambulatory Visit: Payer: Self-pay

## 2023-05-18 ENCOUNTER — Emergency Department (HOSPITAL_COMMUNITY)
Admission: EM | Admit: 2023-05-18 | Discharge: 2023-05-18 | Disposition: A | Discharge status: Home / Self Care | Payer: 59 | Attending: Emergency Medicine | Admitting: Emergency Medicine

## 2023-05-18 ENCOUNTER — Encounter (HOSPITAL_COMMUNITY): Payer: Self-pay | Admitting: *Deleted

## 2023-05-18 DIAGNOSIS — S80811A Abrasion, right lower leg, initial encounter: Secondary | ICD-10-CM | POA: Diagnosis not present

## 2023-05-18 DIAGNOSIS — S0990XA Unspecified injury of head, initial encounter: Secondary | ICD-10-CM | POA: Insufficient documentation

## 2023-05-18 DIAGNOSIS — S32010A Wedge compression fracture of first lumbar vertebra, initial encounter for closed fracture: Secondary | ICD-10-CM | POA: Diagnosis not present

## 2023-05-18 DIAGNOSIS — S80211A Abrasion, right knee, initial encounter: Secondary | ICD-10-CM | POA: Insufficient documentation

## 2023-05-18 DIAGNOSIS — W11XXXA Fall on and from ladder, initial encounter: Secondary | ICD-10-CM

## 2023-05-18 DIAGNOSIS — Z7982 Long term (current) use of aspirin: Secondary | ICD-10-CM | POA: Insufficient documentation

## 2023-05-18 DIAGNOSIS — S34101A Unspecified injury to L1 level of lumbar spinal cord, initial encounter: Secondary | ICD-10-CM | POA: Diagnosis present

## 2023-05-18 MED ORDER — OXYCODONE-ACETAMINOPHEN 5-325 MG PO TABS: 1.0000 | ORAL_TABLET | Freq: Four times a day (QID) | ORAL | 0 refills | Status: AC | PRN

## 2023-05-18 MED ORDER — METHOCARBAMOL 500 MG PO TABS: 500.0000 mg | ORAL_TABLET | Freq: Two times a day (BID) | ORAL | 0 refills | Status: AC

## 2023-05-18 MED ORDER — BACITRACIN ZINC 500 UNIT/GM EX OINT
TOPICAL_OINTMENT | Freq: Two times a day (BID) | CUTANEOUS | Status: DC
  Filled 2023-05-18: qty 0.9

## 2023-05-18 NOTE — ED Triage Notes (Signed)
BIB [facility] from home s/p fall, was standing on ladder in garage, mechanical fall from 3-4' height. Landed on buttocks. C/o low back pain, posterior head, and R knee. C-collar in place. Denies neck pain, LOC, blood thinners. Denies other sx. Skin tear to R knee. Lumbar spine TTP. Alert, NAD, calm, interactive, resps e/u. Family sitting at side.

## 2023-05-18 NOTE — Discharge Instructions (Signed)
TLSO brace as tolerated. Percocet as needed for pain as prescribed. Do NOT drive or operate machinery while taking Percocet. Robaxin as needed as prescribed for muscle tightness. Do NOT drive or operate machinery while taking Robaxin. These medications can cause constipation. Manage with Miralax and colace as needed.   You can apply Voltaren gel as needed as directed. You can use OCT Lidocaine patches as well.   Follow up with neurosurgery, please call to schedule an appointment. Return to ER for severe or concerning symptoms.

## 2023-05-18 NOTE — Progress Notes (Signed)
Orthopedic Tech Progress Note Patient Details:  [name] 10-Jan-1956 [id number]  Ortho Devices Type of Ortho Device: Thoracolumbar corset (TLSO) Ortho Device/Splint Interventions: Ordered, Application, Adjustment   Post Interventions Patient Tolerated: Well Instructions Provided: Adjustment of device, Care of device  Kizzie Fantasia 05/18/2023, 8:56 PM

## 2023-05-18 NOTE — ED Provider Notes (Signed)
[place] EMERGENCY DEPARTMENT AT [facility] Provider Note   CSN: [id number] Arrival date & time: 05/18/23  1811     History  Chief Complaint  Patient presents with   [name] is a 67 y.o. male.  67 year old male brought in by EMS from home for evaluation after a fall. Patient was about 3-4 feet off the ground on a step stool when he fell, landing on his buttocks on the cement ground and then hitting the back of his head. Patient is not on thinners, no LOC, no vomiting. Reports pain across his lower back. Abrasion to right knee and lower leg. Last td less than 5 years ago.        Home Medications Prior to Admission medications   Medication Sig Start Date End Date Taking? Authorizing Provider  methocarbamol (ROBAXIN) 500 MG tablet Take 1 tablet (500 mg total) by mouth 2 (two) times daily. 05/18/23  Yes Jeannie Fend, PA-C  oxyCODONE-acetaminophen (PERCOCET/ROXICET) 5-325 MG tablet Take 1 tablet by mouth every 6 (six) hours as needed for severe pain. 05/18/23  Yes Jeannie Fend, PA-C  aspirin 81 MG tablet Take 81 mg by mouth every evening.    [provider]  cetirizine (ZYRTEC) 10 MG tablet Take 10 mg by mouth daily.    [provider]  Diclofenac-miSOPROStol (ARTHROTEC) 75-0.2 MG TBEC Take by mouth.    [provider]  fish oil-omega-3 fatty acids 1000 MG capsule Take 2 g by mouth daily.    [provider]  Multiple Vitamins-Minerals (MULTIVITAMIN WITH MINERALS) tablet Take 1 tablet by mouth every evening.    [provider]  omeprazole (PRILOSEC) 20 MG capsule Take 20 mg by mouth every evening.    [provider]  simvastatin (ZOCOR) 20 MG tablet Take 20 mg by mouth at bedtime.    [provider]  zolpidem (AMBIEN) 5 MG tablet Take 5 mg by mouth at bedtime as needed.    [provider]      Allergies    Patient has no known allergies.    Review of Systems   Review of  Systems Negative except as per HPI Physical Exam Updated Vital Signs BP (!) 151/93 (BP Location: Left Arm)   Pulse 72   Temp (!) 97.4 F (36.3 C) (Oral)   Resp 18   Wt 95.3 kg   SpO2 99%   BMI 24.90 kg/m  Physical Exam Vitals and nursing note reviewed.  Constitutional:      General: He is not in acute distress.    Appearance: He is well-developed. He is not diaphoretic.  HENT:     Head: Normocephalic and atraumatic.  Cardiovascular:     Pulses: Normal pulses.  Pulmonary:     Effort: Pulmonary effort is normal.  Abdominal:     Palpations: Abdomen is soft.     Tenderness: There is no abdominal tenderness.  Musculoskeletal:        General: Tenderness and signs of injury present. No swelling or deformity.     Cervical back: Normal range of motion and neck supple. No tenderness or bony tenderness. No pain with movement.     Thoracic back: No tenderness or bony tenderness.     Lumbar back: No tenderness or bony tenderness.     Right lower leg: No edema.     Left lower leg: No edema.     Comments: Minor abrasion to right knee and anterior  right lower leg without bony tenderness. Normal ROM bilateral lower extremities/hips.  No pain with ROM upper extremities.   Pain in lower back is not reproduced with palpation, no overlying bony tenderness.   Skin:    General: Skin is warm and dry.     Findings: No erythema or rash.  Neurological:     Mental Status: He is alert and oriented to person, place, and time.     Sensory: No sensory deficit.     Motor: No weakness.     Gait: Gait normal.  Psychiatric:        Behavior: Behavior normal.     ED Results / Procedures / Treatments   Labs (all labs ordered are listed, but only abnormal results are displayed) Labs Reviewed - No data to display  EKG None  Radiology CT Lumbar Spine Wo Contrast  Result Date: 05/18/2023 CLINICAL DATA:  Back trauma, no prior imaging (Age >= 16y).  Fall. EXAM: CT LUMBAR SPINE WITHOUT CONTRAST  TECHNIQUE: Multidetector CT imaging of the lumbar spine was performed without intravenous contrast administration. Multiplanar CT image reconstructions were also generated. RADIATION DOSE REDUCTION: This exam was performed according to the departmental dose-optimization program which includes automated exposure control, adjustment of the mA and/or kV according to patient size and/or use of iterative reconstruction technique. COMPARISON:  None Available. FINDINGS: Segmentation: 5 lumbar type vertebrae. Alignment: 4 mm of anterolisthesis of L5 on S1 due to bilateral pars defects. Vertebrae: Mild compression fracture and anterior wedging at L1. Paraspinal and other soft tissues: Negative Disc levels: Degenerative disc disease at L5-S1 with disc space narrowing and vacuum disc. Diffuse degenerative facet disease most pronounced in the lower lumbar spine. IMPRESSION: Mild compression fracture and anterior wedging involving the L1 vertebral body. Degenerative disc disease and facet disease as above. 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 Electronically Signed   By: Charlett Nose M.D.   On: 05/18/2023 19:53   CT Head Wo Contrast  Result Date: 05/18/2023 CLINICAL DATA:  Trauma EXAM: CT HEAD WITHOUT CONTRAST TECHNIQUE: Contiguous axial images were obtained from the base of the skull through the vertex without intravenous contrast. RADIATION DOSE REDUCTION: This exam was performed according to the departmental dose-optimization program which includes automated exposure control, adjustment of the mA and/or kV according to patient size and/or use of iterative reconstruction technique. COMPARISON:  None Available. FINDINGS: Brain: No evidence of acute infarction, hemorrhage, hydrocephalus, extra-axial collection or mass lesion/mass effect. Vascular: No hyperdense vessel or unexpected calcification. Skull: Normal. Negative for fracture or focal lesion. Sinuses/Orbits: There is an air-fluid level in  the left sphenoid sinus. Other: None. IMPRESSION: 1. No acute intracranial abnormality. 2. Left sphenoid sinusitis. Electronically Signed   By: Darliss Cheney M.D.   On: 05/18/2023 19:52   CT Cervical Spine Wo Contrast  Result Date: 05/18/2023 CLINICAL DATA:  Neck trauma (Age >= 65y) EXAM: CT CERVICAL SPINE WITHOUT CONTRAST TECHNIQUE: Multidetector CT imaging of the cervical spine was performed without intravenous contrast. Multiplanar CT image reconstructions were also generated. RADIATION DOSE REDUCTION: This exam was performed according to the departmental dose-optimization program which includes automated exposure control, adjustment of the mA and/or kV according to patient size and/or use of iterative reconstruction technique. COMPARISON:  04/13/2021 FINDINGS: Alignment: Facet joints are aligned without dislocation or traumatic listhesis. Dens and lateral masses are aligned. Straightening of the cervical lordosis with degenerative grade 1 anterolisthesis of C3 on C4. Skull base and vertebrae: No acute fracture. No  primary bone lesion or focal pathologic process. Soft tissues and spinal canal: No prevertebral fluid or swelling. No visible canal hematoma. Disc levels:  Moderate-advanced multilevel cervical spondylosis. Upper chest: Negative. Other: None. IMPRESSION: No acute fracture or traumatic listhesis of the cervical spine. Electronically Signed   By: Duanne Guess D.O.   On: 05/18/2023 19:51    Procedures Procedures    Medications Ordered in ED Medications  bacitracin ointment (has no administration in time range)    ED Course/ Medical Decision Making/ A&P                                 Medical Decision Making Amount and/or Complexity of Data Reviewed Radiology: ordered.  Risk OTC drugs.   67 year old male presents for evaluation after a fall off of a stepladder as above.  Not anticoagulated, no loss of consciousness.  Neuroexam is unremarkable.  He is found to have minor  abrasion to the right lower leg and right knee without bony tenderness in that area.  No pain with palpation or range of motion of her lower extremities.  Reports pain across his lower back which is worse with movement, no pain with palpation, no midline or bony tenderness.  CT head and C-spine obtained due to fall age greater than or equal to 65 and are negative for acute bony abnormality or intracranial injury.  CT lumbar spine shows an L1 compression fracture.  Imaging reviewed with patient, neuro intact.  Plan is to discharge with TLSO, Percocet and Robaxin with referral to neurosurgery and return to ER precautions.        Final Clinical Impression(s) / ED Diagnoses Final diagnoses:  Fall from ladder, initial encounter  Closed compression fracture of body of L1 vertebra (HCC)    Rx / DC Orders ED Discharge Orders          Ordered    oxyCODONE-acetaminophen (PERCOCET/ROXICET) 5-325 MG tablet  Every 6 hours PRN        05/18/23 2012    methocarbamol (ROBAXIN) 500 MG tablet  2 times daily        05/18/23 2012              Jeannie Fend, PA-C 05/18/23 2019    Arby Barrette, MD 05/23/23 1506

## 2023-08-22 ENCOUNTER — Telehealth: Payer: Self-pay | Admitting: Orthopaedic Surgery

## 2023-08-22 NOTE — Telephone Encounter (Signed)
Pt called requesting to submit to insurance company for left knee gel injection. Please call pt at [phone]

## 2023-08-23 NOTE — Telephone Encounter (Signed)
VOB submitted for Euflexxa, left knee  

## 2023-09-22 ENCOUNTER — Telehealth: Payer: Self-pay

## 2023-09-22 NOTE — Telephone Encounter (Signed)
[name] called triage and LMOM- (VM 11:20am). He is in PCP office requesting Cortisone injection. He is having Euflexxa inj in Jan and would like to know if theres any contraindications.   CB  [name]: [phone]

## 2023-09-22 NOTE — Telephone Encounter (Signed)
[name] is aware this is fine

## 2024-02-26 ENCOUNTER — Other Ambulatory Visit: Payer: Self-pay | Admitting: Family Medicine

## 2024-02-26 DIAGNOSIS — I2510 Atherosclerotic heart disease of native coronary artery without angina pectoris: Secondary | ICD-10-CM

## 2024-02-26 NOTE — Progress Notes (Signed)
 Long h/o HLD.  Now Pre-DM.  Nl Treadmill stress test in 2021.  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

## 2024-03-18 ENCOUNTER — Ambulatory Visit (HOSPITAL_BASED_OUTPATIENT_CLINIC_OR_DEPARTMENT_OTHER)
Admission: RE | Admit: 2024-03-18 | Discharge: 2024-03-18 | Disposition: A | Discharge status: Home / Self Care | Payer: Self-pay | Source: Ambulatory Visit | Attending: Family Medicine | Admitting: Family Medicine

## 2024-03-18 DIAGNOSIS — I2510 Atherosclerotic heart disease of native coronary artery without angina pectoris: Secondary | ICD-10-CM | POA: Insufficient documentation

## 2024-05-02 ENCOUNTER — Other Ambulatory Visit: Payer: Self-pay | Admitting: Family Medicine

## 2024-05-02 DIAGNOSIS — I2510 Atherosclerotic heart disease of native coronary artery without angina pectoris: Secondary | ICD-10-CM

## 2024-05-02 NOTE — Progress Notes (Signed)
 CAC showing possible CAD w/ score 194.  DC Simvastatin and start Crestor.  Repeat CAC in 12 mo (June 2026).  Aneurysm noted and discussed w/ pt.

## 2024-08-12 ENCOUNTER — Encounter: Payer: Self-pay | Admitting: Radiology
